# Patient Record
Sex: Male | Born: 1995
Health system: Southern US, Community
[De-identification: ages and names within clinical notes are randomized; demographics above are authoritative.]

## PROBLEM LIST (undated history)

## (undated) DIAGNOSIS — F29 Unspecified psychosis not due to a substance or known physiological condition: Secondary | ICD-10-CM

## (undated) DIAGNOSIS — F988 Other specified behavioral and emotional disorders with onset usually occurring in childhood and adolescence: Secondary | ICD-10-CM

## (undated) DIAGNOSIS — F602 Antisocial personality disorder: Secondary | ICD-10-CM

---
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
## 2013-08-09 ENCOUNTER — Ambulatory Visit: Payer: Federal, State, Local not specified - PPO | Admitting: Family Medicine

## 2015-10-15 ENCOUNTER — Encounter: Payer: Self-pay | Admitting: Internal Medicine

## 2015-10-15 ENCOUNTER — Ambulatory Visit (INDEPENDENT_AMBULATORY_CARE_PROVIDER_SITE_OTHER): Payer: Federal, State, Local not specified - PPO | Admitting: Internal Medicine

## 2015-10-15 VITALS — BP 124/86 | HR 104 | Temp 98.3°F | Ht 74.0 in | Wt 190.0 lb

## 2015-10-15 DIAGNOSIS — F909 Attention-deficit hyperactivity disorder, unspecified type: Secondary | ICD-10-CM | POA: Diagnosis not present

## 2015-10-15 DIAGNOSIS — F988 Other specified behavioral and emotional disorders with onset usually occurring in childhood and adolescence: Secondary | ICD-10-CM

## 2015-10-15 NOTE — Progress Notes (Signed)
Pre visit review using our clinic review tool, if applicable. No additional management support is needed unless otherwise documented below in the visit note. 

## 2015-10-15 NOTE — Progress Notes (Signed)
HPI  Elijah Castillo presents to the clinic today to establish care and for management of the conditions listed below. He has not had a PCP in many years.  ADD: He reports he was treated with Vyvanse, Adderall, and Concerta as a child. He is not currently taking any medication at this time.  Flu: never Tetanus: 2007 Dentist: as needed  Past Medical History  Diagnosis Date  . ADD (attention deficit disorder)     No current outpatient prescriptions on file.   No current facility-administered medications for this visit.    No Known Allergies  Family History  Problem Relation Age of Onset  . Arthritis Mother   . Hyperlipidemia Mother   . Hypertension Mother   . Alcohol abuse Maternal Grandmother   . Diabetes Maternal Grandmother   . Heart disease Maternal Grandmother   . Diabetes Maternal Grandfather   . Heart disease Maternal Grandfather   . Alcohol abuse Paternal Grandfather     Social History   Social History  . Marital Status: Single    Spouse Name: N/A  . Number of Children: N/A  . Years of Education: N/A   Occupational History  . Not on file.   Social History Main Topics  . Smoking status: Current Every Day Smoker -- 0.33 packs/day    Types: Cigarettes  . Smokeless tobacco: Never Used  . Alcohol Use: 0.0 oz/week    0 Standard drinks or equivalent per week     Comment: rare--beer  . Drug Use: Yes    Special: Marijuana     Comment: daily use  . Sexual Activity: Not on file   Other Topics Concern  . Not on file   Social History Narrative    ROS:  Constitutional: Denies fever, malaise, fatigue, headache or abrupt weight changes.  Respiratory: Denies difficulty breathing, shortness of breath, cough or sputum production.   Cardiovascular: Denies chest pain, chest tightness, palpitations or swelling in the hands or feet.  Skin: Denies redness, rashes, lesions or ulcercations.  Neurological: Denies dizziness, difficulty with memory, difficulty with speech or  problems with balance and coordination.  Psych: Denies anxiety, depression, SI/HI.  No other specific complaints in a complete review of systems (except as listed in HPI above).  PE:  BP 124/86 mmHg  Pulse 104  Temp(Src) 98.3 F (36.8 C) (Oral)  Ht 6\' 2"  (1.88 m)  Wt 190 lb (86.183 kg)  BMI 24.38 kg/m2  SpO2 99%  Wt Readings from Last 3 Encounters:  10/15/15 190 lb (86.183 kg)  12/03/11 215 lb (97.523 kg) (97 %*, Z = 1.85)   * Growth percentiles are based on CDC 2-20 Years data.    General: Appears his stated age, well developed, well nourished in NAD. Skin: Dry and intact. Cardiovascular: Normal rate and rhythm. S1,S2 noted.  No murmur, rubs or gallops noted. Pulmonary/Chest: Normal effort and positive vesicular breath sounds. No respiratory distress. No wheezes, rales or ronchi noted.  Neurological: Alert and oriented.  Psychiatric: Mood and affect normal. Behavior is normal. Judgment and thought content normal.    BMET    Component Value Date/Time   NA 137 06/28/2012 2240   K 3.5 06/28/2012 2240   CL 101 06/28/2012 2240   CO2 25 06/28/2012 2240   GLUCOSE 85 06/28/2012 2240   BUN 16 06/28/2012 2240   CREATININE 1.21 06/28/2012 2240   CALCIUM 9.9 06/28/2012 2240   GFRNONAA 86* 06/28/2012 2240   GFRAA >90 06/28/2012 2240    Lipid Panel  No  results found for: CHOL, TRIG, HDL, CHOLHDL, VLDL, LDLCALC  CBC    Component Value Date/Time   WBC 6.8 06/28/2012 2240   RBC 4.40 06/28/2012 2240   HGB 13.8 06/28/2012 2240   HCT 39.7 06/28/2012 2240   PLT 216 06/28/2012 2240   MCV 90.2 06/28/2012 2240   MCH 31.4 06/28/2012 2240   MCHC 34.8 06/28/2012 2240   RDW 13.0 06/28/2012 2240   LYMPHSABS 1.4 12/03/2011 0529   MONOABS 0.3 12/03/2011 0529   EOSABS 0.0 12/03/2011 0529   BASOSABS 0.0 12/03/2011 0529    Hgb A1C No results found for: HGBA1C   Assessment and Plan:  Make an appt for your annual exam

## 2015-10-15 NOTE — Patient Instructions (Signed)
Attention Deficit Hyperactivity Disorder  Attention deficit hyperactivity disorder (ADHD) is a problem with behavior issues based on the way the brain functions (neurobehavioral disorder). It is a common reason for behavior and academic problems in school.  SYMPTOMS   There are 3 types of ADHD. The 3 types and some of the symptoms include:  · Inattentive.    Gets bored or distracted easily.    Loses or forgets things. Forgets to hand in homework.    Has trouble organizing or completing tasks.    Difficulty staying on task.    An inability to organize daily tasks and school work.    Leaving projects, chores, or homework unfinished.    Trouble paying attention or responding to details. Careless mistakes.    Difficulty following directions. Often seems like is not listening.    Dislikes activities that require sustained attention (like chores or homework).  · Hyperactive-impulsive.    Feels like it is impossible to sit still or stay in a seat. Fidgeting with hands and feet.    Trouble waiting turn.    Talking too much or out of turn. Interruptive.    Speaks or acts impulsively.    Aggressive, disruptive behavior.    Constantly busy or on the go; noisy.    Often leaves seat when they are expected to remain seated.    Often runs or climbs where it is not appropriate, or feels very restless.  · Combined.    Has symptoms of both of the above.  Often children with ADHD feel discouraged about themselves and with school. They often perform well below their abilities in school.  As children get older, the excess motor activities can calm down, but the problems with paying attention and staying organized persist. Most children do not outgrow ADHD but with good treatment can learn to cope with the symptoms.  DIAGNOSIS   When ADHD is suspected, the diagnosis should be made by professionals trained in ADHD. This professional will collect information about the individual suspected of having ADHD. Information must be collected from  various settings where the person lives, works, or attends school.    Diagnosis will include:  · Confirming symptoms began in childhood.  · Ruling out other reasons for the child's behavior.  · The health care providers will check with the child's school and check their medical records.  · They will talk to teachers and parents.  · Behavior rating scales for the child will be filled out by those dealing with the child on a daily basis.  A diagnosis is made only after all information has been considered.  TREATMENT   Treatment usually includes behavioral treatment, tutoring or extra support in school, and stimulant medicines. Because of the way a person's brain works with ADHD, these medicines decrease impulsivity and hyperactivity and increase attention. This is different than how they would work in a person who does not have ADHD. Other medicines used include antidepressants and certain blood pressure medicines.  Most experts agree that treatment for ADHD should address all aspects of the person's functioning. Along with medicines, treatment should include structured classroom management at school. Parents should reward good behavior, provide constant discipline, and set limits. Tutoring should be available for the child as needed.  ADHD is a lifelong condition. If untreated, the disorder can have long-term serious effects into adolescence and adulthood.  HOME CARE INSTRUCTIONS   · Often with ADHD there is a lot of frustration among family members dealing with the condition. Blame   and anger are also feelings that are common. In many cases, because the problem affects the family as a whole, the entire family may need help. A therapist can help the family find better ways to handle the disruptive behaviors of the person with ADHD and promote change. If the person with ADHD is young, most of the therapist's work is with the parents. Parents will learn techniques for coping with and improving their child's behavior.  Sometimes only the child with the ADHD needs counseling. Your health care providers can help you make these decisions.  · Children with ADHD may need help learning how to organize. Some helpful tips include:  ¨ Keep routines the same every day from wake-up time to bedtime. Schedule all activities, including homework and playtime. Keep the schedule in a place where the person with ADHD will often see it. Mark schedule changes as far in advance as possible.  ¨ Schedule outdoor and indoor recreation.  ¨ Have a place for everything and keep everything in its place. This includes clothing, backpacks, and school supplies.  ¨ Encourage writing down assignments and bringing home needed books. Work with your child's teachers for assistance in organizing school work.  · Offer your child a well-balanced diet. Breakfast that includes a balance of whole grains, protein, and fruits or vegetables is especially important for school performance. Children should avoid drinks with caffeine including:  ¨ Soft drinks.  ¨ Coffee.  ¨ Tea.  ¨ However, some older children (adolescents) may find these drinks helpful in improving their attention. Because it can also be common for adolescents with ADHD to become addicted to caffeine, talk with your health care provider about what is a safe amount of caffeine intake for your child.  · Children with ADHD need consistent rules that they can understand and follow. If rules are followed, give small rewards. Children with ADHD often receive, and expect, criticism. Look for good behavior and praise it. Set realistic goals. Give clear instructions. Look for activities that can foster success and self-esteem. Make time for pleasant activities with your child. Give lots of affection.  · Parents are their children's greatest advocates. Learn as much as possible about ADHD. This helps you become a stronger and better advocate for your child. It also helps you educate your child's teachers and instructors  if they feel inadequate in these areas. Parent support groups are often helpful. A national group with local chapters is called Children and Adults with Attention Deficit Hyperactivity Disorder (CHADD).  SEEK MEDICAL CARE IF:  · Your child has repeated muscle twitches, cough, or speech outbursts.  · Your child has sleep problems.  · Your child has a marked loss of appetite.  · Your child develops depression.  · Your child has new or worsening behavioral problems.  · Your child develops dizziness.  · Your child has a racing heart.  · Your child has stomach pains.  · Your child develops headaches.  SEEK IMMEDIATE MEDICAL CARE IF:  · Your child has been diagnosed with depression or anxiety and the symptoms seem to be getting worse.  · Your child has been depressed and suddenly appears to have increased energy or motivation.  · You are worried that your child is having a bad reaction to a medication he or she is taking for ADHD.     This information is not intended to replace advice given to you by your health care provider. Make sure you discuss any questions you have with your   health care provider.     Document Released: 03/06/2002 Document Revised: 03/21/2013 Document Reviewed: 11/21/2012  Elsevier Interactive Patient Education ©2016 [employer]

## 2015-10-15 NOTE — Assessment & Plan Note (Signed)
As a child He is not interested in medication therapy at this time

## 2016-03-04 ENCOUNTER — Ambulatory Visit: Payer: Federal, State, Local not specified - PPO | Admitting: Family Medicine

## 2016-09-25 ENCOUNTER — Ambulatory Visit (INDEPENDENT_AMBULATORY_CARE_PROVIDER_SITE_OTHER): Payer: Federal, State, Local not specified - PPO | Admitting: Licensed Clinical Social Worker

## 2016-09-25 DIAGNOSIS — F1220 Cannabis dependence, uncomplicated: Secondary | ICD-10-CM | POA: Diagnosis not present

## 2016-09-28 ENCOUNTER — Encounter (HOSPITAL_COMMUNITY): Payer: Self-pay | Admitting: Licensed Clinical Social Worker

## 2016-09-28 DIAGNOSIS — K080 Exfoliation of teeth due to systemic causes: Secondary | ICD-10-CM | POA: Diagnosis not present
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
## 2016-10-07 ENCOUNTER — Telehealth (HOSPITAL_COMMUNITY): Payer: Self-pay | Admitting: Licensed Clinical Social Worker

## 2016-10-07 NOTE — Telephone Encounter (Signed)
Counselor phoned for f/o on pt status since CCA 1 week ago. Talked with pt's mother about options for further tx in PHP, individual thearpy, and/or CD-IOP. Mother agreed right now, pt is stable, but is aware of his options for tx and will consider if needed. total call times 3 min.

## 2016-10-19 ENCOUNTER — Telehealth (HOSPITAL_COMMUNITY): Payer: Self-pay | Admitting: Licensed Clinical Social Worker

## 2016-10-19 ENCOUNTER — Encounter (HOSPITAL_COMMUNITY): Payer: Self-pay | Admitting: Licensed Clinical Social Worker

## 2016-10-19 ENCOUNTER — Ambulatory Visit (HOSPITAL_COMMUNITY): Payer: Self-pay | Admitting: Licensed Clinical Social Worker

## 2016-10-19 NOTE — Telephone Encounter (Signed)
Counselor phoned pt's mother to inquire about why pt was missing session. Mother states "he wanted to reschedule for no good reason". She indicated he appears to be "putting counseling off". Counselor asked about any developments into pt's status and there was nothing pressing. Mother said they rescheduled individual counseling for the following week.

## 2016-10-27 ENCOUNTER — Ambulatory Visit (HOSPITAL_COMMUNITY): Payer: Self-pay | Admitting: Licensed Clinical Social Worker

## 2016-11-10 ENCOUNTER — Encounter (HOSPITAL_COMMUNITY): Payer: Self-pay | Admitting: Psychiatry

## 2016-11-10 ENCOUNTER — Ambulatory Visit (INDEPENDENT_AMBULATORY_CARE_PROVIDER_SITE_OTHER): Payer: Federal, State, Local not specified - PPO | Admitting: Psychiatry

## 2016-11-10 VITALS — BP 122/70 | HR 64 | Ht 75.0 in | Wt 193.0 lb

## 2016-11-10 DIAGNOSIS — F1221 Cannabis dependence, in remission: Secondary | ICD-10-CM | POA: Diagnosis not present

## 2016-11-10 DIAGNOSIS — Z62898 Other specified problems related to upbringing: Secondary | ICD-10-CM

## 2016-11-10 DIAGNOSIS — Z811 Family history of alcohol abuse and dependence: Secondary | ICD-10-CM

## 2016-11-10 DIAGNOSIS — F21 Schizotypal disorder: Secondary | ICD-10-CM | POA: Diagnosis not present

## 2016-11-10 DIAGNOSIS — F17210 Nicotine dependence, cigarettes, uncomplicated: Secondary | ICD-10-CM | POA: Diagnosis not present

## 2016-11-10 MED ORDER — FISH OIL 600 MG PO CAPS: ORAL_CAPSULE | ORAL | Status: DC
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
## 2018-06-16 ENCOUNTER — Ambulatory Visit: Payer: Federal, State, Local not specified - PPO | Admitting: Internal Medicine

## 2018-06-16 ENCOUNTER — Other Ambulatory Visit: Payer: Self-pay

## 2018-06-16 ENCOUNTER — Encounter: Payer: Self-pay | Admitting: Internal Medicine

## 2018-06-16 VITALS — BP 124/78 | HR 88 | Temp 98.0°F | Wt 190.0 lb

## 2018-06-16 DIAGNOSIS — R454 Irritability and anger: Secondary | ICD-10-CM

## 2018-06-16 DIAGNOSIS — F339 Major depressive disorder, recurrent, unspecified: Secondary | ICD-10-CM

## 2018-06-16 NOTE — Progress Notes (Signed)
Subjective:    Patient ID: Elijah Castillo, male    DOB: Apr 25, 1995, 23 y.o.   MRN: [id number]  HPI  Pt presents to the clinic today, accompanied by his mother, requesting referral to psych. He was recently incarcerated, released 1 week ago. He was seen by psych during incarceration for Adult ADHD. Mother feels like something else is going on. He denies anxiety, depression, hallucinations, paranoia. He does smoke weed daily. He does report racing thoughts. He denies SI/HI. His mother reports her daughter has shizophrenia, bipolar. She would like him to be further evaluated for mental health conditions.  Review of Systems      Past Medical History:  Diagnosis Date  . ADD (attention deficit disorder)     No current outpatient medications on file.   No current facility-administered medications for this visit.     No Known Allergies  Family History  Problem Relation Age of Onset  . Arthritis Mother   . Hyperlipidemia Mother   . Hypertension Mother   . Alcohol abuse Maternal Grandmother   . Diabetes Maternal Grandmother   . Heart disease Maternal Grandmother   . Diabetes Maternal Grandfather   . Heart disease Maternal Grandfather   . Alcohol abuse Paternal Grandfather     Social History   Socioeconomic History  . Marital status: Single    Spouse name: Not on file  . Number of children: Not on file  . Years of education: Not on file  . Highest education level: Not on file  Occupational History  . Not on file  Social Needs  . Financial resource strain: Not on file  . Food insecurity:    Worry: Not on file    Inability: Not on file  . Transportation needs:    Medical: Not on file    Non-medical: Not on file  Tobacco Use  . Smoking status: Current Every Day Smoker    Packs/day: 0.33    Years: 8.00    Pack years: 2.64    Types: Cigarettes  . Smokeless tobacco: Never Used  Substance and Sexual Activity  . Alcohol use: Yes    Alcohol/week: 0.0 standard drinks     Comment: rare--beer  . Drug use: Yes    Types: Marijuana  . Sexual activity: Yes    Birth control/protection: Condom  Lifestyle  . Physical activity:    Days per week: Not on file    Minutes per session: Not on file  . Stress: Not on file  Relationships  . Social connections:    Talks on phone: Not on file    Gets together: Not on file    Attends religious service: Not on file    Active member of club or organization: Not on file    Attends meetings of clubs or organizations: Not on file    Relationship status: Not on file  . Intimate partner violence:    Fear of current or ex partner: Not on file    Emotionally abused: Not on file    Physically abused: Not on file    Forced sexual activity: Not on file  Other Topics Concern  . Not on file  Social History Narrative  . Not on file     Constitutional: Denies fever, malaise, fatigue, headache or abrupt weight changes.  Respiratory: Denies difficulty breathing, shortness of breath, cough or sputum production.   Cardiovascular: Denies chest pain, chest tightness, palpitations or swelling in the hands or feet.  Neurological: Denies dizziness, difficulty with  memory, difficulty with speech or problems with balance and coordination.  Psych: Denies anxiety, depression, SI/HI.  No other specific complaints in a complete review of systems (except as listed in HPI above).  Objective:   Physical Exam BP 124/78   Pulse 88   Temp 98 F (36.7 C) (Oral)   Wt 190 lb (86.2 kg)   SpO2 98%   BMI 23.75 kg/m  Wt Readings from Last 3 Encounters:  06/16/18 190 lb (86.2 kg)  10/15/15 190 lb (86.2 kg)  12/03/11 215 lb (97.5 kg) (97 %, Z= 1.85)*   * Growth percentiles are based on CDC (Boys, 2-20 Years) data.    General: Appears his stated age, well developed, well nourished in NAD. Cardiovascular: Normal rate and rhythm.  Pulmonary/Chest: Normal effort and positive vesicular breath sounds. No respiratory distress. No wheezes, rales  or ronchi noted.  Neurological: Alert and oriented. Psychiatric: Appears distracted, fidgety. Answers no to all questions. Affect flat. Body language normal.   BMET    Component Value Date/Time   NA 137 06/28/2012 2240   K 3.5 06/28/2012 2240   CL 101 06/28/2012 2240   CO2 25 06/28/2012 2240   GLUCOSE 85 06/28/2012 2240   BUN 16 06/28/2012 2240   CREATININE 1.21 06/28/2012 2240   CALCIUM 9.9 06/28/2012 2240   GFRNONAA 86 (L) 06/28/2012 2240   GFRAA >90 06/28/2012 2240    Lipid Panel  No results found for: CHOL, TRIG, HDL, CHOLHDL, VLDL, LDLCALC  CBC    Component Value Date/Time   WBC 6.8 06/28/2012 2240   RBC 4.40 06/28/2012 2240   HGB 13.8 06/28/2012 2240   HCT 39.7 06/28/2012 2240   PLT 216 06/28/2012 2240   MCV 90.2 06/28/2012 2240   MCH 31.4 06/28/2012 2240   MCHC 34.8 06/28/2012 2240   RDW 13.0 06/28/2012 2240   LYMPHSABS 1.4 12/03/2011 0529   MONOABS 0.3 12/03/2011 0529   EOSABS 0.0 12/03/2011 0529   BASOSABS 0.0 12/03/2011 0529    Hgb A1C No results found for: HGBA1C           Assessment & Plan:   Depression, Excessive Anger:  Referral to psych Discussed appt will likely be made 6-8 weeks out Discussed [facility], location and hours Contracts for safety  Return precautions discussed Nicki Reaper, NP

## 2018-06-16 NOTE — Patient Instructions (Signed)
Depression Screening Depression screening is a tool that your health care provider can use to learn if you have symptoms of depression. Depression is a common condition with many symptoms that are also often found in other conditions. Depression is treatable, but it must first be diagnosed. You may not know that certain feelings, thoughts, and behaviors that you are having can be symptoms of depression. Taking a depression screening test can help you and your health care provider decide if you need more assessment, or if you should be referred to a mental health care provider. What are the screening tests?  You may have a physical exam to see if another condition is affecting your mental health. You may have a blood or urine sample taken during the physical exam.  You may be interviewed using a screening tool that was developed from research, such as one of these: ? Patient Health Questionnaire (PHQ). This is a set of either 2 or 9 questions. A health care provider who has been trained to score this screening test uses a guide to assess if your symptoms suggest that you may have depression. ? Hamilton Depression Rating Scale (HAM-D). This is a set of either 17 or 24 questions. You may be asked to take it again during or after your treatment, to see if your depression has gotten better. ? Beck Depression Inventory (BDI). This is a set of 21 multiple choice questions. Your health care provider scores your answers to assess:  Your level of depression, ranging from mild to severe.  Your response to treatment.  Your health care provider may talk with you about your daily activities, such as eating, sleeping, work, and recreation, and ask if you have had any changes in activity.  Your health care provider may ask you to see a mental health specialist, such as a psychiatrist or psychologist, for more evaluation. Who should be screened for depression?   All adults, including adults with a family history  of a mental health disorder.  Adolescents who are 12-18 years old.  People who are recovering from a myocardial infarction (MI).  Pregnant women, or women who have given birth.  People who have a long-term (chronic) illness.  Anyone who has been diagnosed with another type of a mental health disorder.  Anyone who has symptoms that could show depression. What do my results mean? Your health care provider will review the results of your depression screening, physical exam, and lab tests. Positive screens suggest that you may have depression. Screening is the first step in getting the care that you may need. It is up to you to get your screening results. Ask your health care provider, or the department that is doing your screening tests, when your results will be ready. Talk with your health care provider about your results and diagnosis. A diagnosis of depression is made using the Diagnostic and Statistical Manual of Mental Disorders (DSM-V). This is a book that lists the number and type of symptoms that must be present for a health care provider to give a specific diagnosis.  Your health care provider may work with you to treat your symptoms of depression, or your health care provider may help you find a mental health provider who can assess, diagnose, and treat your depression. Get help right away if:  You have thoughts about hurting yourself or others. If you ever feel like you may hurt yourself or others, or have thoughts about taking your own life, get help right away. You   can go to your nearest emergency department or call:  Your local emergency services (911 in the [place]  A suicide crisis helpline, such as the National Suicide Prevention Lifeline at 1-800-273-8255. This is open 24 hours a day. Summary  Depression screening is the first step in getting the help that you may need.  If your screening test shows symptoms of depression (is positive), your health care provider may ask  you to see a mental health provider.  Anyone who is age 12 or older should be screened for depression. This information is not intended to replace advice given to you by your health care provider. Make sure you discuss any questions you have with your health care provider. Document Released: 07/31/2016 Document Revised: 07/31/2016 Document Reviewed: 07/31/2016 Elsevier Interactive Patient Education  2019 [employer]  

## 2018-06-20 ENCOUNTER — Other Ambulatory Visit: Payer: Self-pay | Admitting: Internal Medicine

## 2018-06-20 DIAGNOSIS — R454 Irritability and anger: Secondary | ICD-10-CM

## 2018-06-20 DIAGNOSIS — F339 Major depressive disorder, recurrent, unspecified: Secondary | ICD-10-CM

## 2018-06-20 NOTE — Progress Notes (Signed)
a 

## 2018-07-19 ENCOUNTER — Ambulatory Visit: Payer: Self-pay | Admitting: *Deleted

## 2018-07-19 ENCOUNTER — Encounter (HOSPITAL_COMMUNITY): Payer: Self-pay

## 2018-07-19 ENCOUNTER — Ambulatory Visit (HOSPITAL_COMMUNITY)
Admission: EM | Admit: 2018-07-19 | Discharge: 2018-07-19 | Disposition: A | Discharge status: Home / Self Care | Payer: Federal, State, Local not specified - PPO | Attending: Family Medicine | Admitting: Family Medicine

## 2018-07-19 ENCOUNTER — Other Ambulatory Visit: Payer: Self-pay

## 2018-07-19 DIAGNOSIS — R079 Chest pain, unspecified: Secondary | ICD-10-CM

## 2018-07-19 DIAGNOSIS — R000 Tachycardia, unspecified: Secondary | ICD-10-CM | POA: Diagnosis not present

## 2018-07-19 DIAGNOSIS — W57XXXA Bitten or stung by nonvenomous insect and other nonvenomous arthropods, initial encounter: Secondary | ICD-10-CM

## 2018-07-19 DIAGNOSIS — R52 Pain, unspecified: Secondary | ICD-10-CM | POA: Diagnosis not present

## 2018-07-19 DIAGNOSIS — R509 Fever, unspecified: Secondary | ICD-10-CM

## 2018-07-19 NOTE — Telephone Encounter (Signed)
Patient is having some pressure in his chest- he is having joint pain and fast heart rate. Patient has inflammation on back- mother thought possible insect bite. Patient does not have cough or fever or congestion.  Reason for Disposition . New or worsened shortness of breath with activity (dyspnea on exertion)    Patient is having pain with inspiration- patient states he woke up this morning with joint pain and has not gotten better today- he states he has increased heart rate and pain with deep breath. Resting heart rate is 110.  Answer Assessment - Initial Assessment Questions 1. DESCRIPTION: "Please describe your heart rate or heart beat that you are having" (e.g., fast/slow, regular/irregular, skipped or extra beats, "palpitations")     Fast- pressure- feels off 2. ONSET: "When did it start?" (Minutes, hours or days)      This morning 3. DURATION: "How long does it last" (e.g., seconds, minutes, hours)     If breathing through nose normally- heart feels fast- but when holds breath he feels pain 4. PATTERN "Does it come and go, or has it been constant since it started?"  "Does it get worse with exertion?"   "Are you feeling it now?"     Constant- worse if laying on left side-right side- yes 5. TAP: "Using your hand, can you tap out what you are feeling on a chair or table in front of you, so that I can hear?" (Note: not all patients can do this)       n/a 6. HEART RATE: "Can you tell me your heart rate?" "How many beats in 15 seconds?"  (Note: not all patients can do this)       110 bpm 7. RECURRENT SYMPTOM: "Have you ever had this before?" If so, ask: "When was the last time?" and "What happened that time?"      no 8. CAUSE: "What do you think is causing the palpitations?"     Unknown-raised areas- several area 9. CARDIAC HISTORY: "Do you have any history of heart disease?" (e.g., heart attack, angina, bypass surgery, angioplasty, arrhythmia)      no 10. OTHER SYMPTOMS: "Do you have any  other symptoms?" (e.g., dizziness, chest pain, sweating, difficulty breathing)       Pain with breathing 11. PREGNANCY: "Is there any chance you are pregnant?" "When was your last menstrual period?"       n/a  Protocols used: HEART RATE AND HEARTBEAT QUESTIONS-A-AH

## 2018-07-19 NOTE — Discharge Instructions (Addendum)
You may take Tylenol or Advil as needed. Please do your best to ensure adequate fluid intake in order to avoid dehydration.

## 2018-07-19 NOTE — ED Triage Notes (Signed)
Pt presents with generalized chest pain, headache, and nausea today upon waking up.

## 2018-07-20 NOTE — ED Provider Notes (Signed)
[facility]   [id number] 07/19/18 Arrival Time: 1654  ASSESSMENT & PLAN:  1. Generalized body aches   2. Chest pain, unspecified type   3. Insect bite, unspecified site, initial encounter    Patient history and exam consistent with non-cardiac cause of chest pain. Conservative measures indicated. OTC analgesics as needed.   Discussed possibility of early viral illness given temp of 99.7 here and slight tachycardia. No signs of skin infection around presumed insect bites.  Chest pain precautions given. Reviewed expectations re: course of current medical issues. Questions answered. Outlined signs and symptoms indicating need for more acute intervention. Patient verbalized understanding. After Visit Summary given.   SUBJECTIVE: History from: patient. Elijah Castillo is a 23 y.o. male who presents with complaint of persistent generalized chest discomfort described as dull that does not radiate. First noticed this morning upon waking. Reports symptoms are unchanged since beginning. Injury or recent strenuous activity: none. "Just feel tired". Mild body aches. Rates as 2/10 in intensity when present; without associated n/v; without associated SOB. Denies: dyspnea, irregular heart beat, lower extremity edema, near-syncope, orthopnea, palpitations, paroxysmal nocturnal dyspnea and syncope. Aggravating factors: have not been identified. Alleviating factors: have not been identified. Recent illnesses: none. Fever: none reported/suspected. Ambulatory without assistance. Self/OTC treatment: none. Illicit drug use: occasional THC.  Social History   Tobacco Use  Smoking Status Current Every Day Smoker  . Packs/day: 0.33  . Years: 8.00  . Pack years: 2.64  . Types: Cigarettes  Smokeless Tobacco Never Used   Social History   Substance and Sexual Activity  Alcohol Use Yes  . Alcohol/week: 0.0 standard drinks   Comment: rare--beer   Also questions possible insect  bites on his back. Mild itching and soreness. Noticed today.  ROS: As per HPI. All other systems negative.   OBJECTIVE:  Vitals:   07/19/18 1712  BP: 135/85  Pulse: (!) 109  Resp: 18  Temp: 99.7 F (37.6 C)  TempSrc: Oral  SpO2: 99%    General appearance: alert, oriented, no acute distress Eyes: PERRLA; EOMI; conjunctivae normal HENT: normocephalic; atraumatic Neck: supple with FROM Lungs: without labored respirations; CTAB Heart: slight tachycardia; regular rhythm without murmer Chest Wall: without tenderness to palpation Abdomen: soft, non-tender; bowel sounds normal; no masses or organomegaly; no guarding or rebound tenderness Extremities: without edema; without calf swelling or tenderness; symmetrical without gross deformities Skin: warm and dry; mid back with a cluster of approx 1mm erythematous wheals/indurations; normal skin temperature; mild tenderness to touch Psychological: alert and cooperative; normal mood and affect  No Known Allergies  Past Medical History:  Diagnosis Date  . ADD (attention deficit disorder)    Social History   Socioeconomic History  . Marital status: Single    Spouse name: Not on file  . Number of children: Not on file  . Years of education: Not on file  . Highest education level: Not on file  Occupational History  . Not on file  Social Needs  . Financial resource strain: Not on file  . Food insecurity:    Worry: Not on file    Inability: Not on file  . Transportation needs:    Medical: Not on file    Non-medical: Not on file  Tobacco Use  . Smoking status: Current Every Day Smoker    Packs/day: 0.33    Years: 8.00    Pack years: 2.64    Types: Cigarettes  . Smokeless tobacco: Never Used  Substance and Sexual Activity  .  Alcohol use: Yes    Alcohol/week: 0.0 standard drinks    Comment: rare--beer  . Drug use: Yes    Types: Marijuana  . Sexual activity: Yes    Birth control/protection: Condom  Lifestyle  . Physical  activity:    Days per week: Not on file    Minutes per session: Not on file  . Stress: Not on file  Relationships  . Social connections:    Talks on phone: Not on file    Gets together: Not on file    Attends religious service: Not on file    Active member of club or organization: Not on file    Attends meetings of clubs or organizations: Not on file    Relationship status: Not on file  . Intimate partner violence:    Fear of current or ex partner: Not on file    Emotionally abused: Not on file    Physically abused: Not on file    Forced sexual activity: Not on file  Other Topics Concern  . Not on file  Social History Narrative  . Not on file   Family History  Problem Relation Age of Onset  . Arthritis Mother   . Hyperlipidemia Mother   . Hypertension Mother   . Alcohol abuse Maternal Grandmother   . Diabetes Maternal Grandmother   . Heart disease Maternal Grandmother   . Diabetes Maternal Grandfather   . Heart disease Maternal Grandfather   . Alcohol abuse Paternal Grandfather    Past Surgical History:  Procedure Laterality Date  . TONSILLECTOMY  Bonnell Public, MD 07/20/18 1311

## 2018-07-20 NOTE — Telephone Encounter (Signed)
Set up [email or web address] ER follow up

## 2018-07-22 NOTE — Telephone Encounter (Signed)
I have attempted to call pt twice, no answer.

## 2018-07-26 NOTE — Telephone Encounter (Signed)
I left another voicemail, third attempt. I requested a call back for scheduling.

## 2018-10-07 ENCOUNTER — Ambulatory Visit (HOSPITAL_COMMUNITY)
Admission: RE | Admit: 2018-10-07 | Discharge: 2018-10-07 | Disposition: A | Discharge status: Home / Self Care | Payer: Federal, State, Local not specified - PPO | Attending: Psychiatry | Admitting: Psychiatry

## 2018-10-07 ENCOUNTER — Ambulatory Visit (HOSPITAL_COMMUNITY): Admit: 2018-10-07 | Payer: Federal, State, Local not specified - PPO | Admitting: Psychiatry

## 2018-10-07 DIAGNOSIS — F17210 Nicotine dependence, cigarettes, uncomplicated: Secondary | ICD-10-CM | POA: Insufficient documentation

## 2018-10-07 DIAGNOSIS — F6381 Intermittent explosive disorder: Secondary | ICD-10-CM | POA: Insufficient documentation
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
## 2018-10-07 NOTE — H&P (Signed)
Behavioral Health Medical Screening Exam  Elijah Castillo is an 23 y.o. male with anger after an argument with his sister. Patient reports he was praying when his sister who is diagnosed paranoid schizophrenic and deemed to have no mental capacity by the state interrupted his prayers. He became upset with her in which he attempted to go shower so he could leave, but was unable to do so as the bathroom was filthy and urine standing in the toilet. This further lead to verbal altercation, and his mother became frightened at that time and iVC the patient. He has a history of ADHD, Cannabis use disorder, ASPD. He has no previous inpatient history, suicide attempts, or suicidal gestures. He has no history of homicidal ideations as per patient, however reports a history of legal charges including "making a bomb threat. At this time he denies SI/HI/AVH at this time. He currently resides with his mother, father, sister, and sisters children. He is out on bail after this time, with most recent time served 80 days in December.   Total Time spent with patient: 30 minutes  Psychiatric Specialty Exam: Physical Exam  ROS  There were no vitals taken for this visit.There is no height or weight on file to calculate BMI.  General Appearance: Guarded  Eye Contact:  Good  Speech:  Clear and Coherent and Pressured  Volume:  Increased  Mood:  Irritable  Affect:  Congruent and Labile  Thought Process:  Coherent, Linear and Descriptions of Associations: Intact  Orientation:  Full (Time, Place, and Person)  Thought Content:  Logical  Suicidal Thoughts:  No  Homicidal Thoughts:  No  Memory:  Immediate;   Fair Recent;   Fair  Judgement:  Fair  Insight:  Fair  Psychomotor Activity:  Increased  Concentration: Concentration: Fair and Attention Span: Fair  Recall:  [employer] of Knowledge:Fair  Language: Fair  Akathisia:  No  Handed:  Right  AIMS (if indicated):     Assets:  Communication Skills Desire for  Improvement Financial Resources/Insurance Housing Leisure Time Physical Health Talents/Skills Transportation Vocational/Educational  Sleep:       Musculoskeletal: Strength & Muscle Tone: within normal limits Gait & Station: normal Patient leans: N/A  There were no vitals taken for this visit.  Recommendations:  Based on my evaluation the patient does not appear to have an emergency medical condition. Will discharge home after collateral is obtained from his mother and father at this time. Upon chart review patient has had a referral placed for anger to psychiatrist and has been followed up with appropriate services. In addition to seeking help on his own for his mental illness. Will continue to recommend outpatient services and anger management at this time.   Suella Broad, FNP 10/07/2018, 3:50 PM

## 2018-12-11 ENCOUNTER — Other Ambulatory Visit: Payer: Self-pay

## 2018-12-11 ENCOUNTER — Emergency Department (HOSPITAL_COMMUNITY)
Admission: EM | Admit: 2018-12-11 | Discharge: 2018-12-11 | Disposition: A | Discharge status: Home / Self Care | Payer: Federal, State, Local not specified - PPO | Attending: Emergency Medicine | Admitting: Emergency Medicine

## 2018-12-11 ENCOUNTER — Emergency Department (HOSPITAL_COMMUNITY): Payer: Federal, State, Local not specified - PPO

## 2018-12-11 ENCOUNTER — Encounter (HOSPITAL_COMMUNITY): Payer: Self-pay | Admitting: Emergency Medicine

## 2018-12-11 DIAGNOSIS — S61213A Laceration without foreign body of left middle finger without damage to nail, initial encounter: Secondary | ICD-10-CM | POA: Insufficient documentation

## 2018-12-11 DIAGNOSIS — Y93H2 Activity, gardening and landscaping: Secondary | ICD-10-CM | POA: Insufficient documentation

## 2018-12-11 DIAGNOSIS — S61412A Laceration without foreign body of left hand, initial encounter: Secondary | ICD-10-CM | POA: Diagnosis not present

## 2018-12-11 DIAGNOSIS — F17210 Nicotine dependence, cigarettes, uncomplicated: Secondary | ICD-10-CM | POA: Diagnosis not present

## 2018-12-11 DIAGNOSIS — W271XXA Contact with garden tool, initial encounter: Secondary | ICD-10-CM | POA: Insufficient documentation

## 2018-12-11 DIAGNOSIS — Y999 Unspecified external cause status: Secondary | ICD-10-CM | POA: Insufficient documentation

## 2018-12-11 DIAGNOSIS — Y929 Unspecified place or not applicable: Secondary | ICD-10-CM | POA: Insufficient documentation

## 2018-12-11 DIAGNOSIS — S61211A Laceration without foreign body of left index finger without damage to nail, initial encounter: Secondary | ICD-10-CM | POA: Insufficient documentation

## 2018-12-11 DIAGNOSIS — S6992XA Unspecified injury of left wrist, hand and finger(s), initial encounter: Secondary | ICD-10-CM | POA: Diagnosis not present

## 2018-12-11 MED ORDER — CEPHALEXIN 250 MG PO CAPS
500.0000 mg | ORAL_CAPSULE | Freq: Once | ORAL | Status: AC
  Administered 2018-12-11: 11:00:00 500 mg via ORAL
  Filled 2018-12-11: qty 2

## 2018-12-11 MED ORDER — CEPHALEXIN 500 MG PO CAPS: 500.0000 mg | ORAL_CAPSULE | Freq: Three times a day (TID) | ORAL | 0 refills | Status: AC

## 2018-12-11 MED ORDER — LIDOCAINE HCL 2 % IJ SOLN
10.0000 mL | Freq: Once | INTRAMUSCULAR | Status: AC
  Administered 2018-12-11: 200 mg
  Filled 2018-12-11: qty 20

## 2018-12-11 NOTE — ED Notes (Signed)
Patient transported to X-ray 

## 2018-12-11 NOTE — ED Provider Notes (Signed)
[facility] Provider Note   CSN: [id number] Arrival date & time: 12/11/18  0815     History   Chief Complaint Chief Complaint  Patient presents with   Finger Injury    HPI Elijah Castillo is a [age] y.o. male without significant past medical history, presenting to the emergency department with sudden onset of lacerations to left second and third digits that occurred prior to arrival.  Patient states he was going to use a machete for lawn work when he accidentally grabbed to the serrated edge instead of the handle of the machete causing a laceration.  Reports mild pain associated, with a laceration to the palmar aspects of the second and third digits.  Denies numbness.  Tetanus was updated about 1 year ago.     The history is provided by the patient.    Past Medical History:  Diagnosis Date   ADD (attention deficit disorder)     Patient Active Problem List   Diagnosis Date Noted   ADD (attention deficit disorder) 10/15/2015    Past Surgical History:  Procedure Laterality Date   TONSILLECTOMY  2000        Home Medications    Prior to Admission medications   Medication Sig Start Date End Date Taking? Authorizing Provider  cephALEXin (KEFLEX) 500 MG capsule Take 1 capsule (500 mg total) by mouth 3 (three) times daily for 7 days. 12/11/18 12/18/18  Gratia Disla, [place] N, PA-C    Family History Family History  Problem Relation Age of Onset   Arthritis Mother    Hyperlipidemia Mother    Hypertension Mother    Alcohol abuse Maternal Grandmother    Diabetes Maternal Grandmother    Heart disease Maternal Grandmother    Diabetes Maternal Grandfather    Heart disease Maternal Grandfather    Alcohol abuse Paternal Grandfather     Social History Social History   Tobacco Use   Smoking status: Current Every Day Smoker    Packs/day: 0.33    Years: 8.00    Pack years: 2.64    Types: Cigarettes   Smokeless tobacco:  Never Used  Substance Use Topics   Alcohol use: Yes    Alcohol/week: 0.0 standard drinks    Comment: rare--beer   Drug use: Yes    Types: Marijuana     Allergies   Patient has no known allergies.   Review of Systems Review of Systems  Skin: Positive for wound.  Neurological: Negative for numbness.     Physical Exam Updated Vital Signs BP 135/78 (BP Location: Right Arm)    Pulse 60    Temp 98.4 F (36.9 C) (Oral)    Resp 20    Ht 6\' 4"  (1.93 m)    Wt 83.9 kg    SpO2 100%    BMI 22.52 kg/m   Physical Exam Vitals signs and nursing note reviewed.  Constitutional:      General: He is not in acute distress.    Appearance: He is well-developed.  HENT:     Head: Normocephalic and atraumatic.  Eyes:     Conjunctiva/sclera: Conjunctivae normal.  Cardiovascular:     Rate and Rhythm: Normal rate.  Pulmonary:     Effort: Pulmonary effort is normal.  Musculoskeletal:     Comments: Left hand:  There is ~2cm horizontal laceration to the palmar aspect of the middle phalanx of the second digit.  Not grossly contaminated.  Patient has some weakness with flexion of the DIP  of the second digit against resistance.  There is a ~2cm horizontal skin flap laceration to the palmar aspect of the proximal phalanx of the third digit.  Not grossly contaminated.  Patient has strong active flexion of the CP and PIP joint of against resistance.  Brisk capillary refill  Neurological:     Mental Status: He is alert.  Psychiatric:        Mood and Affect: Mood normal.        Behavior: Behavior normal.      ED Treatments / Results  Labs (all labs ordered are listed, but only abnormal results are displayed) Labs Reviewed - No data to display  EKG None  Radiology Dg Hand Complete Left  Result Date: 12/11/2018 CLINICAL DATA:  Laceration to the fingers this morning. EXAM: LEFT HAND - COMPLETE 3+ VIEW COMPARISON:  None. FINDINGS: There is no evidence of fracture or dislocation. There is no  evidence of arthropathy or other focal bone abnormality. Lacerations to the volar aspects of the second and third digits. No radiodense foreign bodies in the soft tissues. IMPRESSION: 1. Normal bones.  Soft tissue lacerations. 2. No radiopaque foreign body. Electronically Signed   By: Francene BoyersJames  Maxwell M.D.   On: 12/11/2018 09:02    Procedures .Marland Kitchen.Laceration Repair  Date/Time: 12/11/2018 10:23 AM Performed by: Kasem Mozer, [place] N, PA-C Authorized by: Neilan Rizzo, [place] N, PA-C   Consent:    Consent obtained:  Verbal   Consent given by:  Patient   Risks discussed:  Infection, pain, poor cosmetic result and tendon damage   Alternatives discussed:  No treatment Anesthesia (see MAR for exact dosages):    Anesthesia method:  Nerve block   Block needle gauge:  25 G   Block anesthetic:  Lidocaine 2% w/o epi   Block injection procedure:  Anatomic landmarks palpated   Block outcome:  Anesthesia achieved Laceration details:    Location:  Finger   Finger location:  L index finger   Length (cm):  2 Repair type:    Repair type:  Simple Pre-procedure details:    Preparation:  Patient was prepped and draped in usual sterile fashion and imaging obtained to evaluate for foreign bodies Exploration:    Hemostasis achieved with:  Direct pressure   Wound exploration: wound explored through full range of motion and entire depth of wound probed and visualized     Wound extent: tendon damage     Wound extent: no foreign bodies/material noted     Tendon damage location:  Upper extremity   Upper extremity tendon damage location:  Finger flexor   Tendon repair plan:  Refer for evaluation   Contaminated: no   Treatment:    Area cleansed with:  Saline   Amount of cleaning:  Standard   Irrigation solution:  Sterile saline   Visualized foreign bodies/material removed: no   Skin repair:    Repair method:  Sutures   Suture size:  5-0   Suture material:  Prolene   Suture technique:  Simple interrupted   Number  of sutures:  5 Approximation:    Approximation:  Close Post-procedure details:    Dressing:  Non-adherent dressing and splint for protection   Patient tolerance of procedure:  Tolerated well, no immediate complications .Marland Kitchen.Laceration Repair  Date/Time: 12/11/2018 10:24 AM Performed by: Sabrie Moritz, [place] N, PA-C Authorized by: Tanaiya Kolarik, [place] N, PA-C   Consent:    Consent obtained:  Verbal   Consent given by:  Patient   Risks discussed:  Pain, poor  cosmetic result and infection   Alternatives discussed:  No treatment Anesthesia (see MAR for exact dosages):    Anesthesia method:  Nerve block   Block needle gauge:  25 G   Block anesthetic:  Lidocaine 2% w/o epi   Block injection procedure:  Anatomic landmarks palpated   Block outcome:  Anesthesia achieved Laceration details:    Location:  Finger   Finger location:  L long finger   Length (cm):  2.5 Repair type:    Repair type:  Simple Pre-procedure details:    Preparation:  Patient was prepped and draped in usual sterile fashion and imaging obtained to evaluate for foreign bodies Exploration:    Hemostasis achieved with:  Direct pressure   Wound exploration: wound explored through full range of motion and entire depth of wound probed and visualized     Wound extent: no foreign bodies/material noted and no tendon damage noted     Contaminated: no   Treatment:    Area cleansed with:  Saline   Amount of cleaning:  Standard   Visualized foreign bodies/material removed: no   Skin repair:    Repair method:  Sutures   Suture size:  5-0   Suture material:  Prolene   Suture technique:  Simple interrupted   Number of sutures:  6 Approximation:    Approximation:  Close Post-procedure details:    Dressing:  Non-adherent dressing and splint for protection   Patient tolerance of procedure:  Tolerated well, no immediate complications   (including critical care time)  Medications Ordered in ED Medications  lidocaine (XYLOCAINE) 2 %  (with pres) injection 200 mg (200 mg Infiltration Given 12/11/18 0908)  cephALEXin (KEFLEX) capsule 500 mg (500 mg Oral Given 12/11/18 1048)     Initial Impression / Assessment and Plan / ED Course  I have reviewed the triage vital signs and the nursing notes.  Pertinent labs & imaging results that were available during my care of the patient were reviewed by me and considered in my medical decision making (see chart for details).        Patient presenting with laceration to left second and third digits from a machete that occurred prior to arrival.  Exam is concerning for possible flexor tendon injury of the index finger as patient is having weakness with flexion of the DIP joint.  This was discussed with Dr. Dion Saucier on-call for hand who recommends wound closure at bedside and outpatient follow-up with a hand specialist such as Dr. Merlyn Lot or Geiger.  These recommendations were discussed with patient who is agreeable with plan.  Wounds were closed at bedside.  Tetanus is up-to-date.  Index finger splinted in about 30 to 45 degrees of flexion.  Hand referral provided for close follow-up this week.  Wound care instructions and return precautions discussed.  Patient be discharged with Keflex.  Safe for discharge.  Patient discussed with Dr. Adela Lank.  Discussed results, findings, treatment and follow up. Patient advised of return precautions. Patient verbalized understanding and agreed with plan.  Final Clinical Impressions(s) / ED Diagnoses   Final diagnoses:  Laceration of left index finger without foreign body without damage to nail, initial encounter  Laceration of left middle finger without foreign body without damage to nail, initial encounter    ED Discharge Orders         Ordered    cephALEXin (KEFLEX) 500 MG capsule  3 times daily     12/11/18 1028  Cole Eastridge, [place] N, PA-C 12/11/18 [place], [place], DO 12/11/18 1416

## 2018-12-11 NOTE — Discharge Instructions (Addendum)
Please read instructions below.  Keep your wound clean and covered. In 24 hours, you can get your wound wet; gently clean it with soap and water, pat it dry, and reapply a clean bandage.  Keep the splint on your index finger for protection of the injured tendon. Keep your hand elevated as much as possible to help with swelling. Take the antibiotic as prescribed until gone. You can take ibuprofen/advil every 6 hours as needed for pain Call the hand specialist tomorrow morning to schedule a follow-up visit this week. Return to the ER for fever, pus draining from wound, redness, or new or worsening symptoms.

## 2018-12-11 NOTE — ED Triage Notes (Signed)
Pt. Stated, I was messing around with a machete  and cut my left index and middle finger.

## 2018-12-11 NOTE — ED Notes (Signed)
Pt standing in the door shouting "Nurse, the PA is nearby the pt states that he told us he has somewhere to be and he can't wait too much longer or he will walk out. The PA gives pt an update stating that she will be with him in a moment to suture his hand."

## 2018-12-11 NOTE — ED Notes (Signed)
ED Provider at bedside. 

## 2018-12-13 ENCOUNTER — Other Ambulatory Visit: Payer: Self-pay

## 2018-12-13 ENCOUNTER — Other Ambulatory Visit (HOSPITAL_COMMUNITY)
Admission: RE | Admit: 2018-12-13 | Discharge: 2018-12-13 | Disposition: A | Discharge status: Home / Self Care | Payer: Federal, State, Local not specified - PPO | Source: Ambulatory Visit | Attending: Orthopedic Surgery | Admitting: Orthopedic Surgery

## 2018-12-13 ENCOUNTER — Encounter (HOSPITAL_BASED_OUTPATIENT_CLINIC_OR_DEPARTMENT_OTHER): Payer: Self-pay

## 2018-12-13 DIAGNOSIS — Z20828 Contact with and (suspected) exposure to other viral communicable diseases: Secondary | ICD-10-CM | POA: Diagnosis not present

## 2018-12-13 DIAGNOSIS — Z01812 Encounter for preprocedural laboratory examination: Secondary | ICD-10-CM | POA: Insufficient documentation

## 2018-12-13 DIAGNOSIS — S66321A Laceration of extensor muscle, fascia and tendon of left index finger at wrist and hand level, initial encounter: Secondary | ICD-10-CM | POA: Diagnosis not present

## 2018-12-13 DIAGNOSIS — S66121A Laceration of flexor muscle, fascia and tendon of left index finger at wrist and hand level, initial encounter: Secondary | ICD-10-CM | POA: Diagnosis not present

## 2018-12-13 DIAGNOSIS — S61213A Laceration without foreign body of left middle finger without damage to nail, initial encounter: Secondary | ICD-10-CM | POA: Diagnosis not present

## 2018-12-13 LAB — SARS CORONAVIRUS 2 (TAT 6-24 HRS): SARS Coronavirus 2: NEGATIVE

## 2018-12-14 ENCOUNTER — Other Ambulatory Visit: Payer: Self-pay | Admitting: Orthopedic Surgery

## 2018-12-15 ENCOUNTER — Ambulatory Visit (HOSPITAL_BASED_OUTPATIENT_CLINIC_OR_DEPARTMENT_OTHER): Payer: Federal, State, Local not specified - PPO | Admitting: Anesthesiology

## 2018-12-15 ENCOUNTER — Encounter (HOSPITAL_BASED_OUTPATIENT_CLINIC_OR_DEPARTMENT_OTHER): Payer: Self-pay | Admitting: *Deleted

## 2018-12-15 ENCOUNTER — Encounter (HOSPITAL_BASED_OUTPATIENT_CLINIC_OR_DEPARTMENT_OTHER)
Admission: RE | Disposition: A | Discharge status: Home / Self Care | Payer: Self-pay | Source: Home / Self Care | Attending: Orthopedic Surgery

## 2018-12-15 ENCOUNTER — Other Ambulatory Visit: Payer: Self-pay

## 2018-12-15 ENCOUNTER — Ambulatory Visit (HOSPITAL_BASED_OUTPATIENT_CLINIC_OR_DEPARTMENT_OTHER)
Admission: RE | Admit: 2018-12-15 | Discharge: 2018-12-15 | Disposition: A | Discharge status: Home / Self Care | Payer: Federal, State, Local not specified - PPO | Attending: Orthopedic Surgery | Admitting: Orthopedic Surgery

## 2018-12-15 DIAGNOSIS — S61213A Laceration without foreign body of left middle finger without damage to nail, initial encounter: Secondary | ICD-10-CM | POA: Insufficient documentation

## 2018-12-15 DIAGNOSIS — F17210 Nicotine dependence, cigarettes, uncomplicated: Secondary | ICD-10-CM | POA: Insufficient documentation

## 2018-12-15 DIAGNOSIS — S61211A Laceration without foreign body of left index finger without damage to nail, initial encounter: Secondary | ICD-10-CM | POA: Diagnosis not present

## 2018-12-15 DIAGNOSIS — W260XXA Contact with knife, initial encounter: Secondary | ICD-10-CM | POA: Diagnosis not present

## 2018-12-15 DIAGNOSIS — G8918 Other acute postprocedural pain: Secondary | ICD-10-CM | POA: Diagnosis not present

## 2018-12-15 DIAGNOSIS — S56122A Laceration of flexor muscle, fascia and tendon of left index finger at forearm level, initial encounter: Secondary | ICD-10-CM | POA: Diagnosis not present

## 2018-12-15 DIAGNOSIS — S56192A Other injury of flexor muscle, fascia and tendon of left index finger at forearm level, initial encounter: Secondary | ICD-10-CM | POA: Diagnosis not present

## 2018-12-15 DIAGNOSIS — S56124A Laceration of flexor muscle, fascia and tendon of left middle finger at forearm level, initial encounter: Secondary | ICD-10-CM | POA: Diagnosis not present

## 2018-12-15 HISTORY — PX: NERVE, TENDON AND ARTERY REPAIR: SHX5695

## 2018-12-15 SURGERY — NERVE, TENDON AND ARTERY REPAIR
Anesthesia: General | Site: Hand | Laterality: Left

## 2018-12-15 MED ORDER — CHLORHEXIDINE GLUCONATE 4 % EX LIQD: 60.0000 mL | Freq: Once | CUTANEOUS | Status: DC

## 2018-12-15 MED ORDER — FENTANYL CITRATE (PF) 100 MCG/2ML IJ SOLN
50.0000 ug | INTRAMUSCULAR | Status: AC | PRN
  Administered 2018-12-15 (×2): 50 ug via INTRAVENOUS
  Administered 2018-12-15: 25 ug via INTRAVENOUS

## 2018-12-15 MED ORDER — DEXAMETHASONE SODIUM PHOSPHATE 10 MG/ML IJ SOLN
INTRAMUSCULAR | Status: DC | PRN
  Administered 2018-12-15: 10 mg via INTRAVENOUS

## 2018-12-15 MED ORDER — FENTANYL CITRATE (PF) 100 MCG/2ML IJ SOLN
INTRAMUSCULAR | Status: AC
  Filled 2018-12-15: qty 2

## 2018-12-15 MED ORDER — DEXAMETHASONE SODIUM PHOSPHATE 10 MG/ML IJ SOLN
INTRAMUSCULAR | Status: AC
  Filled 2018-12-15: qty 1

## 2018-12-15 MED ORDER — ACETAMINOPHEN 325 MG PO TABS: 325.0000 mg | ORAL_TABLET | Freq: Once | ORAL | Status: DC | PRN

## 2018-12-15 MED ORDER — ONDANSETRON HCL 4 MG/2ML IJ SOLN
INTRAMUSCULAR | Status: DC | PRN
  Administered 2018-12-15: 4 mg via INTRAVENOUS

## 2018-12-15 MED ORDER — LIDOCAINE 2% (20 MG/ML) 5 ML SYRINGE
INTRAMUSCULAR | Status: DC | PRN
  Administered 2018-12-15: 40 mg via INTRAVENOUS

## 2018-12-15 MED ORDER — FENTANYL CITRATE (PF) 100 MCG/2ML IJ SOLN: 25.0000 ug | INTRAMUSCULAR | Status: DC | PRN

## 2018-12-15 MED ORDER — EPHEDRINE SULFATE 50 MG/ML IJ SOLN
INTRAMUSCULAR | Status: DC | PRN
  Administered 2018-12-15: 10 mg via INTRAVENOUS

## 2018-12-15 MED ORDER — SCOPOLAMINE 1 MG/3DAYS TD PT72: 1.0000 | MEDICATED_PATCH | Freq: Once | TRANSDERMAL | Status: DC

## 2018-12-15 MED ORDER — OXYCODONE HCL 5 MG PO TABS: 5.0000 mg | ORAL_TABLET | Freq: Once | ORAL | Status: DC | PRN

## 2018-12-15 MED ORDER — PROPOFOL 10 MG/ML IV BOLUS
INTRAVENOUS | Status: DC | PRN
  Administered 2018-12-15: 50 mg via INTRAVENOUS
  Administered 2018-12-15: 250 mg via INTRAVENOUS

## 2018-12-15 MED ORDER — PROPOFOL 10 MG/ML IV BOLUS
INTRAVENOUS | Status: AC
  Filled 2018-12-15: qty 20

## 2018-12-15 MED ORDER — MIDAZOLAM HCL 2 MG/2ML IJ SOLN
INTRAMUSCULAR | Status: AC
  Filled 2018-12-15: qty 2

## 2018-12-15 MED ORDER — PROMETHAZINE HCL 25 MG/ML IJ SOLN: 6.2500 mg | INTRAMUSCULAR | Status: DC | PRN

## 2018-12-15 MED ORDER — ONDANSETRON HCL 4 MG/2ML IJ SOLN
INTRAMUSCULAR | Status: AC
  Filled 2018-12-15: qty 2

## 2018-12-15 MED ORDER — CEFAZOLIN SODIUM-DEXTROSE 2-4 GM/100ML-% IV SOLN
2.0000 g | INTRAVENOUS | Status: AC
  Administered 2018-12-15: 12:00:00 2 g via INTRAVENOUS

## 2018-12-15 MED ORDER — OXYCODONE HCL 5 MG/5ML PO SOLN: 5.0000 mg | Freq: Once | ORAL | Status: DC | PRN

## 2018-12-15 MED ORDER — MIDAZOLAM HCL 2 MG/2ML IJ SOLN
1.0000 mg | INTRAMUSCULAR | Status: DC | PRN
  Administered 2018-12-15: 2 mg via INTRAVENOUS

## 2018-12-15 MED ORDER — ACETAMINOPHEN 10 MG/ML IV SOLN: 1000.0000 mg | Freq: Once | INTRAVENOUS | Status: DC | PRN

## 2018-12-15 MED ORDER — LACTATED RINGERS IV SOLN
INTRAVENOUS | Status: DC
  Administered 2018-12-15 (×2): via INTRAVENOUS

## 2018-12-15 MED ORDER — LIDOCAINE 2% (20 MG/ML) 5 ML SYRINGE
INTRAMUSCULAR | Status: AC
  Filled 2018-12-15: qty 5

## 2018-12-15 MED ORDER — MEPERIDINE HCL 25 MG/ML IJ SOLN: 6.2500 mg | INTRAMUSCULAR | Status: DC | PRN

## 2018-12-15 MED ORDER — BUPIVACAINE-EPINEPHRINE (PF) 0.5% -1:200000 IJ SOLN
INTRAMUSCULAR | Status: DC | PRN
  Administered 2018-12-15: 30 mL via PERINEURAL

## 2018-12-15 MED ORDER — HYDROCODONE-ACETAMINOPHEN 5-325 MG PO TABS: ORAL_TABLET | ORAL | 0 refills | Status: DC

## 2018-12-15 MED ORDER — CEFAZOLIN SODIUM-DEXTROSE 2-4 GM/100ML-% IV SOLN
INTRAVENOUS | Status: AC
  Filled 2018-12-15: qty 100

## 2018-12-15 MED ORDER — ACETAMINOPHEN 160 MG/5ML PO SOLN: 325.0000 mg | Freq: Once | ORAL | Status: DC | PRN

## 2018-12-15 MED ORDER — LACTATED RINGERS IV SOLN: INTRAVENOUS | Status: DC

## 2018-12-15 SURGICAL SUPPLY — 61 items
BAG DECANTER FOR FLEXI CONT (MISCELLANEOUS) IMPLANT
BLADE MINI RND TIP GREEN BEAV (BLADE) IMPLANT
BLADE SURG 15 STRL LF DISP TIS (BLADE) ×2 IMPLANT
BLADE SURG 15 STRL SS (BLADE) ×4
BNDG ELASTIC 3X5.8 VLCR STR LF (GAUZE/BANDAGES/DRESSINGS) ×3 IMPLANT
BNDG ESMARK 4X9 LF (GAUZE/BANDAGES/DRESSINGS) IMPLANT
BNDG GAUZE ELAST 4 BULKY (GAUZE/BANDAGES/DRESSINGS) ×3 IMPLANT
BRUSH SCRUB EZ PLAIN DRY (MISCELLANEOUS) ×3 IMPLANT
CATH ROBINSON RED A/P 10FR (CATHETERS) IMPLANT
CHLORAPREP W/TINT 26 (MISCELLANEOUS) ×3 IMPLANT
CORD BIPOLAR FORCEPS 12FT (ELECTRODE) ×3 IMPLANT
COVER BACK TABLE REUSABLE LG (DRAPES) ×3 IMPLANT
COVER MAYO STAND REUSABLE (DRAPES) ×3 IMPLANT
COVER WAND RF STERILE (DRAPES) IMPLANT
CUFF TOURN SGL QUICK 18X4 (TOURNIQUET CUFF) IMPLANT
DECANTER SPIKE VIAL GLASS SM (MISCELLANEOUS) ×3 IMPLANT
DRAPE EXTREMITY T 121X128X90 (DISPOSABLE) ×3 IMPLANT
DRAPE SURG 17X23 STRL (DRAPES) ×3 IMPLANT
GAUZE SPONGE 4X4 12PLY STRL (GAUZE/BANDAGES/DRESSINGS) ×3 IMPLANT
GAUZE XEROFORM 1X8 LF (GAUZE/BANDAGES/DRESSINGS) ×3 IMPLANT
GLOVE BIO SURGEON STRL SZ7.5 (GLOVE) ×3 IMPLANT
GLOVE BIOGEL PI IND STRL 8 (GLOVE) ×1 IMPLANT
GLOVE BIOGEL PI IND STRL 8.5 (GLOVE) IMPLANT
GLOVE BIOGEL PI INDICATOR 8 (GLOVE) ×2
GLOVE BIOGEL PI INDICATOR 8.5 (GLOVE)
GLOVE SURG ORTHO 8.0 STRL STRW (GLOVE) IMPLANT
GOWN STRL REUS W/ TWL LRG LVL3 (GOWN DISPOSABLE) ×1 IMPLANT
GOWN STRL REUS W/TWL LRG LVL3 (GOWN DISPOSABLE) ×2
GOWN STRL REUS W/TWL XL LVL3 (GOWN DISPOSABLE) ×3 IMPLANT
LOOP VESSEL MAXI BLUE (MISCELLANEOUS) IMPLANT
NDL HYPO 25X1 1.5 SAFETY (NEEDLE) IMPLANT
NDL SAFETY ECLIPSE 18X1.5 (NEEDLE) IMPLANT
NEEDLE HYPO 18GX1.5 SHARP (NEEDLE)
NEEDLE HYPO 25X1 1.5 SAFETY (NEEDLE) IMPLANT
NS IRRIG 1000ML POUR BTL (IV SOLUTION) ×3 IMPLANT
PACK BASIN DAY SURGERY FS (CUSTOM PROCEDURE TRAY) ×3 IMPLANT
PAD CAST 3X4 CTTN HI CHSV (CAST SUPPLIES) ×1 IMPLANT
PAD CAST 4YDX4 CTTN HI CHSV (CAST SUPPLIES) IMPLANT
PADDING CAST ABS 4INX4YD NS (CAST SUPPLIES) ×2
PADDING CAST ABS COTTON 4X4 ST (CAST SUPPLIES) ×1 IMPLANT
PADDING CAST COTTON 3X4 STRL (CAST SUPPLIES) ×2
PADDING CAST COTTON 4X4 STRL (CAST SUPPLIES)
SLEEVE SCD COMPRESS KNEE MED (MISCELLANEOUS) IMPLANT
SPEAR EYE SURG WECK-CEL (MISCELLANEOUS) ×3 IMPLANT
SPLINT PLASTER CAST XFAST 3X15 (CAST SUPPLIES) IMPLANT
SPLINT PLASTER XTRA FASTSET 3X (CAST SUPPLIES)
STOCKINETTE 4X48 STRL (DRAPES) ×3 IMPLANT
SUT ETHIBOND 3-0 V-5 (SUTURE) IMPLANT
SUT ETHILON 4 0 PS 2 18 (SUTURE) ×3 IMPLANT
SUT FIBERWIRE 4-0 18 TAPR NDL (SUTURE)
SUT NYLON 9 0 VRM6 (SUTURE) IMPLANT
SUT PROLENE 6 0 P 1 18 (SUTURE) IMPLANT
SUT SILK 4 0 PS 2 (SUTURE) IMPLANT
SUT SUPRAMID 4-0 (SUTURE) IMPLANT
SUT VICRYL 4-0 PS2 18IN ABS (SUTURE) IMPLANT
SUTURE FIBERWR 4-0 18 TAPR NDL (SUTURE) IMPLANT
SYR BULB 3OZ (MISCELLANEOUS) ×3 IMPLANT
SYR CONTROL 10ML LL (SYRINGE) IMPLANT
TOWEL GREEN STERILE FF (TOWEL DISPOSABLE) ×6 IMPLANT
TRAY DSU PREP LF (CUSTOM PROCEDURE TRAY) IMPLANT
UNDERPAD 30X36 HEAVY ABSORB (UNDERPADS AND DIAPERS) ×3 IMPLANT

## 2018-12-15 NOTE — Transfer of Care (Signed)
Immediate Anesthesia Transfer of Care Note  Patient: Elijah Castillo  Procedure(s) Performed: EXPLORATION REPAIR TENDON LEFT INDEX AND LACERATION LEFT MIDDLE FINGER (Left Hand)  Patient Location: PACU  Anesthesia Type:General and Regional  Level of Consciousness: awake and sedated  Airway & Oxygen Therapy: Patient Spontanous Breathing and Patient connected to face mask oxygen  Post-op Assessment: Report given to RN and Post -op Vital signs reviewed and stable  Post vital signs: Reviewed and stable  Last Vitals:  Vitals Value Taken Time  BP 107/50 12/15/18 1339  Temp    Pulse 60 12/15/18 1342  Resp 19 12/15/18 1342  SpO2 100 % 12/15/18 1342  Vitals shown include unvalidated device data.  Last Pain:  Vitals:   12/15/18 1039  TempSrc: Oral  PainSc: 0-No pain      Patients Stated Pain Goal: 1 (48/01/65 [id number])  Complications: No apparent anesthesia complications

## 2018-12-15 NOTE — Op Note (Signed)
I assisted Surgeon(s) and Role:    * Leanora Cover, MD - Primary    Daryll Brod, MD - Assisting on the Procedure(s): EXPLORATION REPAIR TENDON LEFT INDEX AND LACERATION LEFT MIDDLE FINGER on 12/15/2018.  I provided assistance on this case as follows:setup, approach, exploration of nerves in both fingers, repair of flexor tendon, closure and application of the dressings and splint.  Electronically signed by: Daryll Brod, MD Date: 12/15/2018 Time: 1:36 PM

## 2018-12-15 NOTE — Anesthesia Procedure Notes (Signed)
Anesthesia Regional Block: Supraclavicular block   Pre-Anesthetic Checklist: ,, timeout performed, Correct Patient, Correct Site, Correct Laterality, Correct Procedure, Correct Position, site marked, Risks and benefits discussed,  Surgical consent,  Pre-op evaluation,  At surgeon's request and post-op pain management  Laterality: Left  Prep: chloraprep       Needles:  Injection technique: Single-shot  Needle Type: Echogenic Stimulator Needle     Needle Length: 9cm  Needle Gauge: 21     Additional Needles:   Procedures:,,,, ultrasound used (permanent image in chart),,,,  Narrative:  Start time: 12/15/2018 11:55 AM End time: 12/15/2018 12:05 PM Injection made incrementally with aspirations every 5 mL.  Performed by: Personally  Anesthesiologist: Effie Berkshire, MD  Additional Notes: Patient tolerated the procedure well. Local anesthetic introduced in an incremental fashion under minimal resistance after negative aspirations. No paresthesias were elicited. After completion of the procedure, no acute issues were identified and patient continued to be monitored by RN.

## 2018-12-15 NOTE — Anesthesia Postprocedure Evaluation (Signed)
Anesthesia Post Note  Patient: Elijah Castillo  Procedure(s) Performed: EXPLORATION REPAIR TENDON LEFT INDEX AND LACERATION LEFT MIDDLE FINGER (Left Hand)     Patient location during evaluation: PACU Anesthesia Type: General Level of consciousness: awake and alert Pain management: pain level controlled Vital Signs Assessment: post-procedure vital signs reviewed and stable Respiratory status: spontaneous breathing, nonlabored ventilation, respiratory function stable and patient connected to nasal cannula oxygen Cardiovascular status: blood pressure returned to baseline and stable Postop Assessment: no apparent nausea or vomiting Anesthetic complications: no    Last Vitals:  Vitals:   12/15/18 1424 12/15/18 1456  BP: (!) 168/114 (!) 161/107  Pulse: (!) 52 (!) 52  Resp: 15 18  Temp:  36.4 C  SpO2: 99% 99%    Last Pain:  Vitals:   12/15/18 1445  TempSrc:   PainSc: 0-No pain                 Effie Berkshire

## 2018-12-15 NOTE — Discharge Instructions (Addendum)

## 2018-12-15 NOTE — H&P (Signed)
Elijah Castillo is an 23 y.o. male.   Chief Complaint: left index and long finger lacerations HPI: 23 yo male states he lacerated left index and long fingers on machete while doing yard work 4 days ago.  Unable to flex index finger dip joint.  Decreased sensation in finger tip.  He wishes to have operative exploration with repair of tendon/arteyr/nerve as necessary.  Allergies: No Known Allergies  Past Medical History:  Diagnosis Date  . ADD (attention deficit disorder)     Past Surgical History:  Procedure Laterality Date  . TONSILLECTOMY  2000    Family History: Family History  Problem Relation Age of Onset  . Arthritis Mother   . Hyperlipidemia Mother   . Hypertension Mother   . Alcohol abuse Maternal Grandmother   . Diabetes Maternal Grandmother   . Heart disease Maternal Grandmother   . Diabetes Maternal Grandfather   . Heart disease Maternal Grandfather   . Alcohol abuse Paternal Grandfather     Social History:   reports that he has been smoking cigarettes. He has a 2.64 pack-year smoking history. He has never used smokeless tobacco. He reports current alcohol use. He reports current drug use. Drug: Marijuana.  Medications: Medications Prior to Admission  Medication Sig Dispense Refill  . cephALEXin (KEFLEX) 500 MG capsule Take 1 capsule (500 mg total) by mouth 3 (three) times daily for 7 days. 21 capsule 0    Results for orders placed or performed during the hospital encounter of 12/13/18 (from the past 48 hour(s))  SARS CORONAVIRUS 2 (TAT 6-24 HRS) Nasopharyngeal Nasopharyngeal Swab     Status: None   Collection Time: 12/13/18  1:28 PM   Specimen: Nasopharyngeal Swab  Result Value Ref Range   SARS Coronavirus 2 NEGATIVE NEGATIVE    Comment: (NOTE) SARS-CoV-2 target nucleic acids are NOT DETECTED. The SARS-CoV-2 RNA is generally detectable in upper and lower respiratory specimens during the acute phase of infection. Negative results do not preclude SARS-CoV-2  infection, do not rule out co-infections with other pathogens, and should not be used as the sole basis for treatment or other patient management decisions. Negative results must be combined with clinical observations, patient history, and epidemiological information. The expected result is Negative. Fact Sheet for Patients: SugarRoll.be Fact Sheet for Healthcare Providers: https://www.woods-mathews.com/ This test is not yet approved or cleared by the [place] FDA and  has been authorized for detection and/or diagnosis of SARS-CoV-2 by FDA under an Emergency Use Authorization (EUA). This EUA will remain  in effect (meaning this test can be used) for the duration of the COVID-19 declaration under Section 56 4(b)(1) of the Act, 21 U.S.C. section 360bbb-3(b)(1), unless the authorization is terminated or revoked sooner. Performed at [facility], [place] [address] [place], [place] [address]     No results found.   A comprehensive review of systems was negative.  Blood pressure 127/80, pulse (!) 59, temperature 98.1 F (36.7 C), temperature source Oral, resp. rate 18, height 6\' 4"  (1.93 m), weight 82.5 kg.  General appearance: alert, cooperative and appears stated age Head: Normocephalic, without obvious abnormality, atraumatic Neck: supple, symmetrical, trachea midline Cardio: regular rate and rhythm Resp: clear to auscultation bilaterally Extremities: Intact sensation and capillary refill all digits except decreased sensation in index fingertip.  +epl/fpl/io.  Unable to flex dip of index finger.   Pulses: 2+ and symmetric Skin: Skin color, texture, turgor normal. No rashes or lesions Neurologic: Grossly normal Incision/Wound: laceration volarly at proximal phalanx  index finger and middle phalanx of long finger.  Assessment/Plan Left index and long finger lacerations with possible tendon/artery/nerve lacerations.  Plan operative  exploration with repair tendon/artery/nerve as necessary.  Risks, benefits, and alternatives of surgery have been discussed and the patient agrees with the plan of care.   Betha LoaKevin Lynn Sissel 12/15/2018, 10:57 AM

## 2018-12-15 NOTE — Anesthesia Procedure Notes (Signed)
Procedure Name: LMA Insertion Performed by: Terrance Mass, CRNA Pre-anesthesia Checklist: Patient identified, Emergency Drugs available, Suction available and Patient being monitored Patient Re-evaluated:Patient Re-evaluated prior to induction Oxygen Delivery Method: Circle system utilized Preoxygenation: Pre-oxygenation with 100% oxygen Induction Type: IV induction Ventilation: Mask ventilation without difficulty LMA: LMA inserted LMA Size: 5.0 Number of attempts: 1 Airway Equipment and Method: Bite block Placement Confirmation: positive ETCO2 Tube secured with: Tape Dental Injury: Teeth and Oropharynx as per pre-operative assessment  Comments: Inserted by Lady Deutscher

## 2018-12-15 NOTE — Op Note (Signed)
NAME: KEREM GILMER MEDICAL RECORD NO: [id number] DATE OF BIRTH: 03/30/1996 FACILITY[facility] LOCATION: [facility] PHYSICIAN: Tennis Must, MD   OPERATIVE REPORT   DATE OF PROCEDURE: 12/15/18    PREOPERATIVE DIAGNOSIS:   Left index and long finger lacerations with possible tendon artery nerve laceration   POSTOPERATIVE DIAGNOSIS:   Left index finger FDP laceration and left long finger laceration   PROCEDURE:   1.  Left index finger repair of FDP laceration zone 2 2.  Left long finger exploration and repair of laceration   SURGEON:  Leanora Cover, M.D.   ASSISTANT: Daryll Brod, MD   ANESTHESIA:  General with regional   INTRAVENOUS FLUIDS:  Per anesthesia flow sheet.   ESTIMATED BLOOD LOSS:  Minimal.   COMPLICATIONS:  None.   SPECIMENS:  none   TOURNIQUET TIME:    Total Tourniquet Time Documented: Upper Arm (Left) - 60 minutes Total: Upper Arm (Left) - 60 minutes    DISPOSITION:  Stable to PACU.   INDICATIONS: 23 year old male states he sustained lacerations to the left index and long finger in a machete 4 days ago.  He was seen at the emergency department where the wounds were cleaned and sutured.  He has inability to flex the DIP joint of the index finger.  He wishes to proceed with operative exploration repair as necessary. Risks, benefits and alternatives of surgery were discussed including the risks of blood loss, infection, damage to nerves, vessels, tendons, ligaments, bone for surgery, need for additional surgery, complications with wound healing, continued pain, stiffness.  He voiced understanding of these risks and elected to proceed.  OPERATIVE COURSE:  After being identified preoperatively by myself,  the patient and I agreed on the procedure and site of the procedure.  The surgical site was marked.  Surgical consent had been signed. He was given IV antibiotics as preoperative antibiotic prophylaxis. He was transferred to the operating room and  placed on the operating table in supine position with the Left upper extremity on an arm board.  General anesthesia was induced by the anesthesiologist. A regional block had been performed by anesthesia in preoperative holding.   Left upper extremity was prepped and draped in normal sterile orthopedic fashion.  A surgical pause was performed between the surgeons, anesthesia, and operating room staff and all were in agreement as to the patient, procedure, and site of procedure.  Tourniquet at the proximal aspect of the extremity was inflated to 250 mmHg after exsanguination of the arm with an Esmarch bandage.    Sutures were removed from the wound.  The long finger wound was explored first.  This went into the subcutaneous tissues.  The radial and ulnar neurovascular bundles were identified and were intact.  The laceration did not go down to the flexor tendon sheath.  Wound was irrigated with sterile saline and closed with 4-0 nylon in a horizontal mattress fashion.  The index finger wound was then explored.  It was extended both proximally and distally in a Drexel fashion to aid in visualization.  The radial and ulnar neurovascular bundles were identified and were intact.  There was laceration of the FDP tendon distal to the A4 pulley.  Laceration was distal to the FDS tendon.  The A5 pulley was released to aid in visualization of the distal stump.  The proximal stump was able to be retrieved.  A 4 oh looped Supramid suture was then passed in a modified Kessler technique through the tendon.  A running 6-0 Prolene epitendinous suture was passed as well.  Good reapproximation of the tendon ends was achieved without bunching.  There was no gap.  The A4 pulley was intact.  The wound was copiously irrigated with sterile saline and closed with 4-0 nylon in a horizontal mattress fashion.  The wounds were dressed with sterile Xeroform 4 x 4's and wrapped with a Kerlix bandage.  Dorsal blocking splint was placed with the  wrist at 30 to 40 degrees flexion the MPs flexed and the IP is extended.  This was wrapped with Kerlix and Ace bandage.  The tourniquet was deflated at 60 minutes.  Fingertips were pink with brisk capillary refill after deflation of tourniquet.  The operative  drapes were broken down.  The patient was awoken from anesthesia safely.  He was transferred back to the stretcher and taken to PACU in stable condition.  I will see him back in the office in 1 week for postoperative followup.  I will give him a prescription for Norco 5/325 1-2 tabs PO q6 hours prn pain, dispense # 30.   Betha LoaKevin Taige Housman, MD Electronically signed, 12/15/18

## 2018-12-15 NOTE — Anesthesia Preprocedure Evaluation (Signed)
Anesthesia Evaluation  Patient identified by MRN, date of birth, ID band Patient awake    Reviewed: Allergy & Precautions, NPO status , Patient's Chart, lab work & pertinent test results  Airway Mallampati: I  TM Distance: >3 FB Neck ROM: Full    Dental no notable dental hx.    Pulmonary Current Smoker,    Pulmonary exam normal        Cardiovascular negative cardio ROS Normal cardiovascular exam     Neuro/Psych negative neurological ROS     GI/Hepatic negative GI ROS, Neg liver ROS,   Endo/Other  negative endocrine ROS  Renal/GU negative Renal ROS     Musculoskeletal negative musculoskeletal ROS (+)   Abdominal Normal abdominal exam  (+)   Peds  Hematology negative hematology ROS (+)   Anesthesia Other Findings   Reproductive/Obstetrics                             Anesthesia Physical Anesthesia Plan  ASA: II  Anesthesia Plan: General   Post-op Pain Management: GA combined w/ Regional for post-op pain   Induction: Intravenous  PONV Risk Score and Plan: 2 and Ondansetron, Dexamethasone and Midazolam  Airway Management Planned: LMA  Additional Equipment: None  Intra-op Plan:   Post-operative Plan: Extubation in OR  Informed Consent: I have reviewed the patients History and Physical, chart, labs and discussed the procedure including the risks, benefits and alternatives for the proposed anesthesia with the patient or authorized representative who has indicated his/her understanding and acceptance.     Dental advisory given  Plan Discussed with: CRNA  Anesthesia Plan Comments:         Anesthesia Quick Evaluation

## 2018-12-19 ENCOUNTER — Encounter (HOSPITAL_BASED_OUTPATIENT_CLINIC_OR_DEPARTMENT_OTHER): Payer: Self-pay | Admitting: Orthopedic Surgery

## 2018-12-23 DIAGNOSIS — S66121D Laceration of flexor muscle, fascia and tendon of left index finger at wrist and hand level, subsequent encounter: Secondary | ICD-10-CM | POA: Diagnosis not present

## 2018-12-23 DIAGNOSIS — S61213D Laceration without foreign body of left middle finger without damage to nail, subsequent encounter: Secondary | ICD-10-CM | POA: Diagnosis not present

## 2018-12-23 DIAGNOSIS — R29898 Other symptoms and signs involving the musculoskeletal system: Secondary | ICD-10-CM | POA: Diagnosis not present

## 2018-12-23 DIAGNOSIS — M79642 Pain in left hand: Secondary | ICD-10-CM | POA: Diagnosis not present

## 2018-12-23 DIAGNOSIS — M25642 Stiffness of left hand, not elsewhere classified: Secondary | ICD-10-CM | POA: Diagnosis not present

## 2018-12-28 ENCOUNTER — Other Ambulatory Visit: Payer: Self-pay

## 2018-12-28 ENCOUNTER — Emergency Department (HOSPITAL_COMMUNITY)
Admission: EM | Admit: 2018-12-28 | Discharge: 2018-12-29 | Disposition: A | Discharge status: Home / Self Care | Payer: Federal, State, Local not specified - PPO | Attending: Emergency Medicine | Admitting: Emergency Medicine

## 2018-12-28 DIAGNOSIS — Z638 Other specified problems related to primary support group: Secondary | ICD-10-CM | POA: Diagnosis not present

## 2018-12-28 DIAGNOSIS — F17210 Nicotine dependence, cigarettes, uncomplicated: Secondary | ICD-10-CM | POA: Diagnosis not present

## 2018-12-28 DIAGNOSIS — F909 Attention-deficit hyperactivity disorder, unspecified type: Secondary | ICD-10-CM | POA: Diagnosis not present

## 2018-12-28 DIAGNOSIS — Z046 Encounter for general psychiatric examination, requested by authority: Secondary | ICD-10-CM | POA: Insufficient documentation

## 2018-12-28 DIAGNOSIS — F333 Major depressive disorder, recurrent, severe with psychotic symptoms: Secondary | ICD-10-CM | POA: Diagnosis not present

## 2018-12-28 DIAGNOSIS — F988 Other specified behavioral and emotional disorders with onset usually occurring in childhood and adolescence: Secondary | ICD-10-CM | POA: Diagnosis present

## 2018-12-28 DIAGNOSIS — R451 Restlessness and agitation: Secondary | ICD-10-CM | POA: Diagnosis not present

## 2018-12-28 DIAGNOSIS — Z008 Encounter for other general examination: Secondary | ICD-10-CM

## 2018-12-28 NOTE — ED Notes (Signed)
Pt reading from the [facility].
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
## 2018-12-28 NOTE — ED Notes (Signed)
TTS assessing him now via tele psych

## 2018-12-28 NOTE — ED Triage Notes (Signed)
Pt to room 29. Pt brought in by sheriff. IVDd   Pt agitated. Pt stated here because of argument

## 2018-12-28 NOTE — ED Notes (Signed)
On initial assessment he is agitated, verbally aggressive, insistent he is not staying here and he will become violent if he is made to stay here. He states he needs to leave here for the safety of his children. He reiterates this position over and over again, and states he is at risk of getting COVID virus while he is here, and he is going to walk out of this place. Dr Sedonia Small on the unit while writer speaking with him and came in to assess him. Ordered a TTS evaluation which is what is being waited on now.
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
## 2018-12-29 ENCOUNTER — Encounter (HOSPITAL_COMMUNITY): Payer: Self-pay | Admitting: Registered Nurse

## 2018-12-29 DIAGNOSIS — Z638 Other specified problems related to primary support group: Secondary | ICD-10-CM | POA: Diagnosis not present

## 2018-12-29 DIAGNOSIS — F909 Attention-deficit hyperactivity disorder, unspecified type: Secondary | ICD-10-CM

## 2018-12-29 NOTE — ED Notes (Signed)
Pt DCd to home per MD. Pt alert, calm, cooperative, no s/s of distress.  DC information given to and reviewed with pt, with acknowledged understanding. Belongings given to pt. Pt ambulatory off unit, escorted by [facility]. Per pt, using bus.
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
## 2018-12-29 NOTE — BH Assessment (Signed)
[facility] Assessment Progress Note  Per Buford Dresser, DO, this pt does not require psychiatric hospitalization at this time.  Pt presents under IVC initiated by pt's mother, and upheld by EDP Gerlene Fee, MD, which Dr Mariea Clonts has rescinded.  Pt is to be discharged from [facility] with recommendation to follow up with an outpatient psychiatrist.  Several options have been included in pt's discharge instructions.  Pt's nurse, Eustaquio Maize, has been notified.  Jalene Mullet, [facility] Triage Specialist [phone]

## 2018-12-29 NOTE — Discharge Instructions (Signed)
For your behavioral health needs, you are advised to follow up with an outpatient psychiatrist.  Contact one of the following providers at your earliest opportunity to ask about scheduling an intake appointment: ° °     [facility] [place] °     [address] °     [place], [place] [address] °     [phone] ° °     [facility] °     [address] °     [place], [place] [address] °     [phone] ° °     [facility] °     [address] °     [place], [place] [address] °     [phone] °
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
## 2021-01-16 ENCOUNTER — Emergency Department (HOSPITAL_COMMUNITY)
Admission: EM | Admit: 2021-01-16 | Discharge: 2021-01-17 | Disposition: A | Discharge status: Other Acute Inpatient Hospital | Payer: Federal, State, Local not specified - PPO | Attending: Emergency Medicine | Admitting: Emergency Medicine

## 2021-01-16 DIAGNOSIS — Z20822 Contact with and (suspected) exposure to covid-19: Secondary | ICD-10-CM | POA: Insufficient documentation

## 2021-01-16 DIAGNOSIS — F17210 Nicotine dependence, cigarettes, uncomplicated: Secondary | ICD-10-CM | POA: Insufficient documentation

## 2021-01-16 DIAGNOSIS — Y900 Blood alcohol level of less than 20 mg/100 ml: Secondary | ICD-10-CM | POA: Insufficient documentation

## 2021-01-16 DIAGNOSIS — F29 Unspecified psychosis not due to a substance or known physiological condition: Secondary | ICD-10-CM | POA: Insufficient documentation

## 2021-01-16 LAB — CBC WITH DIFFERENTIAL/PLATELET
Abs Immature Granulocytes: 0.01 10*3/uL (ref 0.00–0.07)
Basophils Absolute: 0 10*3/uL (ref 0.0–0.1)
Basophils Relative: 0 %
Eosinophils Absolute: 0 10*3/uL (ref 0.0–0.5)
Eosinophils Relative: 0 %
HCT: 41.7 % (ref 39.0–52.0)
Hemoglobin: 14.3 g/dL (ref 13.0–17.0)
Immature Granulocytes: 0 %
Lymphocytes Relative: 12 %
Lymphs Abs: 0.9 10*3/uL (ref 0.7–4.0)
MCH: 31.2 pg (ref 26.0–34.0)
MCHC: 34.3 g/dL (ref 30.0–36.0)
MCV: 91 fL (ref 80.0–100.0)
Monocytes Absolute: 0.7 10*3/uL (ref 0.1–1.0)
Monocytes Relative: 9 %
Neutro Abs: 5.8 10*3/uL (ref 1.7–7.7)
Neutrophils Relative %: 79 %
Platelets: 226 10*3/uL (ref 150–400)
RBC: 4.58 MIL/uL (ref 4.22–5.81)
RDW: 13.5 % (ref 11.5–15.5)
WBC: 7.4 10*3/uL (ref 4.0–10.5)
nRBC: 0 % (ref 0.0–0.2)

## 2021-01-16 LAB — COMPREHENSIVE METABOLIC PANEL
ALT: 19 U/L (ref 0–44)
AST: 32 U/L (ref 15–41)
Albumin: 5.1 g/dL — ABNORMAL HIGH (ref 3.5–5.0)
Alkaline Phosphatase: 44 U/L (ref 38–126)
Anion gap: 13 (ref 5–15)
BUN: 16 mg/dL (ref 6–20)
CO2: 22 mmol/L (ref 22–32)
Calcium: 9.7 mg/dL (ref 8.9–10.3)
Chloride: 100 mmol/L (ref 98–111)
Creatinine, Ser: 1.65 mg/dL — ABNORMAL HIGH (ref 0.61–1.24)
GFR, Estimated: 58 mL/min — ABNORMAL LOW (ref >60)
Glucose, Bld: 87 mg/dL (ref 70–99)
Potassium: 3.6 mmol/L (ref 3.5–5.1)
Sodium: 135 mmol/L (ref 135–145)
Total Bilirubin: 1.4 mg/dL — ABNORMAL HIGH (ref 0.3–1.2)
Total Protein: 9.2 g/dL — ABNORMAL HIGH (ref 6.5–8.1)

## 2021-01-16 LAB — RESP PANEL BY RT-PCR (FLU A&B, COVID) ARPGX2
Influenza A by PCR: NEGATIVE
Influenza B by PCR: NEGATIVE
SARS Coronavirus 2 by RT PCR: NEGATIVE

## 2021-01-16 LAB — ETHANOL: Alcohol, Ethyl (B): <10 mg/dL (ref <10)

## 2021-01-16 MED ORDER — LACTATED RINGERS IV BOLUS
1000.0000 mL | Freq: Once | INTRAVENOUS | Status: AC
  Administered 2021-01-16: 1000 mL via INTRAVENOUS

## 2021-01-16 MED ORDER — ACETAMINOPHEN 325 MG PO TABS: 650.0000 mg | ORAL_TABLET | ORAL | Status: DC | PRN

## 2021-01-16 MED ORDER — LORAZEPAM 1 MG PO TABS
1.0000 mg | ORAL_TABLET | ORAL | Status: AC | PRN
  Administered 2021-01-16: 1 mg via ORAL
  Filled 2021-01-16: qty 1

## 2021-01-16 MED ORDER — LORAZEPAM 2 MG/ML IJ SOLN: 2.0000 mg | Freq: Once | INTRAMUSCULAR | Status: DC

## 2021-01-16 MED ORDER — ZOLPIDEM TARTRATE 5 MG PO TABS: 5.0000 mg | ORAL_TABLET | Freq: Every evening | ORAL | Status: DC | PRN

## 2021-01-16 MED ORDER — ZIPRASIDONE MESYLATE 20 MG IM SOLR
20.0000 mg | INTRAMUSCULAR | Status: AC | PRN
  Administered 2021-01-16: 20 mg via INTRAMUSCULAR
  Filled 2021-01-16: qty 20

## 2021-01-16 MED ORDER — ONDANSETRON HCL 4 MG PO TABS
4.0000 mg | ORAL_TABLET | Freq: Three times a day (TID) | ORAL | Status: DC | PRN
Start: 2021-01-16 — End: 2021-01-17

## 2021-01-16 MED ORDER — LACTATED RINGERS IV SOLN
INTRAVENOUS | Status: DC
  Administered 2021-01-16: 125 mL/h via INTRAVENOUS

## 2021-01-16 MED ORDER — NICOTINE 21 MG/24HR TD PT24
21.0000 mg | MEDICATED_PATCH | Freq: Every day | TRANSDERMAL | Status: DC
  Filled 2021-01-16: qty 1

## 2021-01-16 MED ORDER — ALUM & MAG HYDROXIDE-SIMETH 200-200-20 MG/5ML PO SUSP: 30.0000 mL | Freq: Four times a day (QID) | ORAL | Status: DC | PRN

## 2021-01-16 MED ORDER — LORAZEPAM 1 MG PO TABS
2.0000 mg | ORAL_TABLET | Freq: Once | ORAL | Status: DC
  Administered 2021-01-16: 2 mg via ORAL

## 2021-01-16 MED ORDER — RISPERIDONE 0.5 MG PO TBDP
2.0000 mg | ORAL_TABLET | Freq: Three times a day (TID) | ORAL | Status: DC | PRN
  Administered 2021-01-16: 2 mg via ORAL
  Filled 2021-01-16: qty 4

## 2021-01-16 NOTE — ED Notes (Addendum)
Attempting to triage pt, but he is unable to answer questions. Pt affect is hostile and uncooperative. Pt handcuffed and with 3 [employer] officers. Pt is standing, leaning toward [employer] officers, and threatening officers with violence. He is exhibiting magical thinking, grandiose delusions, and hyper religiosity. His speech is pressured, rapid, excessively loud, and compulsive. Pt makes statements such as, "I am a God. I am on a light mission. This is against my religion. I expose shit. I told you, I am the law. I can have you detained, but it's in a different type of jail. I got power over the devil. I'll snatch your brains through your asshole. You play like you don't see the light. You are scared...Marland Kitchen"

## 2021-01-16 NOTE — ED Provider Notes (Signed)
[facility]-EMERGENCY DEPT Provider Note   CSN: [id number] Arrival date & time: 01/16/21  1049     History Chief Complaint  Patient presents with   Delusional    Elijah Castillo is a 25 y.o. male.  The history is provided by the patient. No language interpreter was used.   25 year old male recently history of ADD brought here accompanied by [employer] from home with IVC paper due to aggressive behaviors.  Per IVC paperwork, family member noticed that the patient was acting erratically and aggressively towards other members as well as patient thinks everyone is out to get him, thinking that he is God and believes that he is currently married with children's.  He refused help.  He is not on any medication. Unfortunately unable to obtain additional history as patient is currently noncompliance, yelling at staff.  Level 5 caveat is due to psychiatric illness.  Past Medical History:  Diagnosis Date   ADD (attention deficit disorder)     Patient Active Problem List   Diagnosis Date Noted   Family discord 12/29/2018   ADD (attention deficit disorder) 10/15/2015    Past Surgical History:  Procedure Laterality Date   NERVE, TENDON AND ARTERY REPAIR Left 12/15/2018   Procedure: EXPLORATION REPAIR TENDON LEFT INDEX AND LACERATION LEFT MIDDLE FINGER;  Surgeon: Betha Loa, MD;  Location: [facility];  Service: Orthopedics;  Laterality: Left;   TONSILLECTOMY  2000       Family History  Problem Relation Age of Onset   Arthritis Mother    Hyperlipidemia Mother    Hypertension Mother    Alcohol abuse Maternal Grandmother    Diabetes Maternal Grandmother    Heart disease Maternal Grandmother    Diabetes Maternal Grandfather    Heart disease Maternal Grandfather    Alcohol abuse Paternal Grandfather     Social History   Tobacco Use   Smoking status: Every Day    Packs/day: 0.33    Years: 8.00    Pack years: 2.64    Types: Cigarettes    Smokeless tobacco: Never  Vaping Use   Vaping Use: Never used  Substance Use Topics   Alcohol use: Yes    Alcohol/week: 0.0 standard drinks    Comment: rare--beer   Drug use: Yes    Types: Marijuana    Home Medications Prior to Admission medications   Not on File    Allergies    Patient has no known allergies.  Review of Systems   Review of Systems  Unable to perform ROS: Psychiatric disorder   Physical Exam Updated Vital Signs BP (!) 148/84 (BP Location: Right Arm)   Pulse 82   Temp 97.7 F (36.5 C) (Oral)   Resp 17   SpO2 100%   Physical Exam Vitals and nursing note reviewed.  Constitutional:      Appearance: He is well-developed.     Comments: Patient is sitting in bed, with handcuffs in place, verbally aggressive  HENT:     Head: Atraumatic.  Eyes:     Conjunctiva/sclera: Conjunctivae normal.  Musculoskeletal:     Cervical back: Neck supple.  Skin:    Findings: No rash.  Neurological:     Mental Status: He is alert.     GCS: GCS eye subscore is 4. GCS verbal subscore is 5. GCS motor subscore is 6.  Psychiatric:        Mood and Affect: Affect is angry.        Speech: Speech  is rapid and pressured.        Behavior: Behavior is uncooperative.        Thought Content: Thought content does not include homicidal or suicidal ideation.        Judgment: Judgment is inappropriate.    ED Results / Procedures / Treatments   Labs (all labs ordered are listed, but only abnormal results are displayed) Labs Reviewed  COMPREHENSIVE METABOLIC PANEL - Abnormal; Notable for the following components:      Result Value   Creatinine, Ser 1.65 (*)    Total Protein 9.2 (*)    Albumin 5.1 (*)    Total Bilirubin 1.4 (*)    GFR, Estimated 58 (*)    All other components within normal limits  RESP PANEL BY RT-PCR (FLU A&B, COVID) ARPGX2  CBC WITH DIFFERENTIAL/PLATELET  ETHANOL  RAPID URINE DRUG SCREEN, HOSP PERFORMED    EKG None  Radiology No results  found.  Procedures Procedures   Medications Ordered in ED Medications  risperiDONE (RISPERDAL M-TABS) disintegrating tablet 2 mg (2 mg Oral Patient Refused/Not Given 01/16/21 1128)    And  LORazepam (ATIVAN) tablet 1 mg (1 mg Oral Given 01/16/21 1128)    And  ziprasidone (GEODON) injection 20 mg (20 mg Intramuscular Given 01/16/21 1126)  acetaminophen (TYLENOL) tablet 650 mg (has no administration in time range)  zolpidem (AMBIEN) tablet 5 mg (has no administration in time range)  ondansetron (ZOFRAN) tablet 4 mg (has no administration in time range)  alum & mag hydroxide-simeth (MAALOX/MYLANTA) 200-200-20 MG/5ML suspension 30 mL (has no administration in time range)  nicotine (NICODERM CQ - dosed in mg/24 hours) patch 21 mg (has no administration in time range)    ED Course  I have reviewed the triage vital signs and the nursing notes.  Pertinent labs & imaging results that were available during my care of the patient were reviewed by me and considered in my medical decision making (see chart for details).    MDM Rules/Calculators/A&P                           BP (!) 148/84 (BP Location: Right Arm)   Pulse 82   Temp 97.7 F (36.5 C) (Oral)   Resp 17   SpO2 100%   Final Clinical Impression(s) / ED Diagnoses Final diagnoses:  Psychosis, unspecified psychosis type (HCC)    Rx / DC Orders ED Discharge Orders     None      11:17 AM Patient brought here with IVC paper due to aggressive behavior and psychosis.  No prior diagnosis of bipolar.  Does have history of anxiety.  He appears manic on exam, with pressured speech, and non cooperative.  Patient will likely benefit from chemical restraints for the safety of the patient and staff.  Will perform first exam and will consult psychiatry for further managements of his psychiatric illness.  1:56 PM Labs show AKI, likely 2/2 his manic state.  Pt otherwise medically cleared and can be assess further by psychiatry.    Fayrene Helper, PA-C 01/16/21 1519    Tegeler, Canary Brim, MD 01/16/21 [phone]
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
## 2021-01-16 NOTE — ED Notes (Signed)
Pt belongings placed in locker #29. Pt dressed in burgundy scrubs. Pt refuses to remove necklace.
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
## 2021-01-16 NOTE — Progress Notes (Signed)
Inpatient Behavioral Health Placement  Pt meets inpatient criteria per Karel Jarvis, PA. Per [facility] Rosey Bath, RN there are not appropriate beds at [facility].  Referral was sent to the following out of network facilities:  Destination Service Provider Address Phone Fax  [facility]  [address] [place] [address] [phone]  [facility]  [address], [place] [address] [phone]  [facility]  [address] [place] [address] [phone]  [facility]  [address] [place] [address] [phone]  [facility]  [address], [place] [address] [phone]  [facility]  [phone] [address] [place] [address] [phone]  [facility]  [address] [place] [address] [phone]  [facility]  [address], [place] [address] [phone]  [facility]  [address] [place] [address] [phone]  [facility]  [address] [place] [address] [phone]  [facility]  [address] [place], [place] [address] [phone]  [facility]  [address], [place] [address] [phone]    Situation ongoing,  CSW will follow up.   Maryjean Ka, MSW, [facility] 01/16/2021  @ 11:43 PM

## 2021-01-16 NOTE — ED Notes (Signed)
Pt cooperative with vitals. Pt refuses to remove his necklace stating, "It's against my religion."

## 2021-01-17 LAB — BASIC METABOLIC PANEL
Anion gap: 6 (ref 5–15)
BUN: 16 mg/dL (ref 6–20)
CO2: 26 mmol/L (ref 22–32)
Calcium: 9 mg/dL (ref 8.9–10.3)
Chloride: 107 mmol/L (ref 98–111)
Creatinine, Ser: 1.13 mg/dL (ref 0.61–1.24)
GFR, Estimated: >60 mL/min (ref >60)
Glucose, Bld: 93 mg/dL (ref 70–99)
Potassium: 4 mmol/L (ref 3.5–5.1)
Sodium: 139 mmol/L (ref 135–145)

## 2021-01-17 MED ORDER — ZIPRASIDONE MESYLATE 20 MG IM SOLR: 20.0000 mg | INTRAMUSCULAR | Status: DC | PRN

## 2021-01-17 MED ORDER — OLANZAPINE 10 MG PO TBDP: 10.0000 mg | ORAL_TABLET | Freq: Every day | ORAL | Status: DC

## 2021-01-17 MED ORDER — LORAZEPAM 1 MG PO TABS: 1.0000 mg | ORAL_TABLET | ORAL | Status: DC | PRN

## 2021-01-17 MED ORDER — OLANZAPINE 10 MG PO TBDP: 10.0000 mg | ORAL_TABLET | Freq: Three times a day (TID) | ORAL | Status: DC | PRN

## 2021-01-17 NOTE — BH Assessment (Addendum)
BHH Assessment Progress Note   Per Karel Jarvis, PA, this pt requires psychiatric hospitalization at this time.  Pt presents under IVC initiated by pt's father and upheld by EDP Lynden Oxford, MD. At 15:07 Broadus John calls from [facility] to report that pt has been accepted to their Delta Unit by Dr Margarita Rana.  Maxie Barb, NP concurs with this decision.  EDP's Norman Clay, MD and Vanetta Mulders, MD, as well as pt's nurse, Addison Naegeli, have been notified, and Addison Naegeli agrees to call report to [phone].  Pt is to be transported via [facility].  Doylene Canning Behavioral Health Coordinator [phone]

## 2021-01-17 NOTE — Progress Notes (Signed)
01/17/2021  1107 EKG completed. Labs drawn and sent to main lab. Light green, dark green, and purple tubes sent.

## 2021-01-17 NOTE — ED Provider Notes (Signed)
Emergency Medicine Observation Re-evaluation Note  Elijah Castillo is a 25 y.o. male, seen on rounds today.  Pt initially presented to the ED for complaints of Delusional Currently, the patient is awake, alert, intermittent episodes of agitation  Physical Exam  BP 124/74 (BP Location: Right Arm)   Pulse 76   Temp 98.5 F (36.9 C) (Oral)   Resp 20   SpO2 98%  Physical Exam General: No acute distress Cardiac: Regular rate and rhythm Lungs: Normal breathing rate, no tachypnea Psych: Continued delusional expressions  ED Course / MDM  EKG:   I have reviewed the labs performed to date as well as medications administered while in observation.  Plan  Current plan is for inpatient psychiatric placement.  Elijah Castillo is under involuntary commitment.      Cheryll Cockayne, MD 01/17/21 [phone]

## 2021-01-17 NOTE — Progress Notes (Signed)
01/17/2021  1620  Sheriff called back and is able to transport patient. Called [facility] and gave report to [place].

## 2021-01-17 NOTE — Progress Notes (Signed)
01/17/2021  Called Sheriff [phone] Left message that patient needed to be transported to [facility] called back stating they will be available to take patient tomorrow 10/22 morning after 8am.

## 2021-01-17 NOTE — ED Provider Notes (Signed)
Patient excepted for inpatient psychiatric care by [facility] delta unit Dr. Guss Bunde.  Patient is IVC will be transported by [facility].   Vanetta Mulders, MD 01/17/21 [phone]

## 2021-01-17 NOTE — Progress Notes (Signed)
01/17/2021  1540  Called [facility] [phone] informed them that [facility] would not be available tonight to transport patient, but will transport patient in the morning. Per [name] patient bed will still be available for [facility] to bring patient on 10/22 after 8am.

## 2021-01-17 NOTE — BH Assessment (Signed)
BHH Assessment Progress Note   Per Karel Jarvis, PA, this pt requires psychiatric hospitalization at this time.  Pt presents under IVC initiated by pt's father and upheld by EDP Lynden Oxford, MD.  [facility] is currently at capacity.  The following facilities have been contacted to seek placement for this pt, with results as noted:  Beds available, information sent, decision pending: [facility]nn Donalda [place] [place]  At capacity: [facility]   Doylene Canning, [place] Behavioral Health Coordinator [phone]

## 2021-01-29 ENCOUNTER — Other Ambulatory Visit: Payer: Self-pay

## 2021-01-29 ENCOUNTER — Ambulatory Visit (INDEPENDENT_AMBULATORY_CARE_PROVIDER_SITE_OTHER): Payer: No Payment, Other | Admitting: Physician Assistant

## 2021-01-29 ENCOUNTER — Encounter (HOSPITAL_COMMUNITY): Payer: Self-pay | Admitting: Physician Assistant

## 2021-01-29 VITALS — HR 95 | Ht 74.0 in | Wt 182.0 lb

## 2021-01-29 DIAGNOSIS — F259 Schizoaffective disorder, unspecified: Secondary | ICD-10-CM

## 2021-01-29 DIAGNOSIS — F5105 Insomnia due to other mental disorder: Secondary | ICD-10-CM | POA: Diagnosis not present

## 2021-01-29 DIAGNOSIS — F99 Mental disorder, not otherwise specified: Secondary | ICD-10-CM | POA: Diagnosis not present

## 2021-01-29 MED ORDER — TRAZODONE HCL 50 MG PO TABS
50.0000 mg | ORAL_TABLET | Freq: Every day | ORAL | 1 refills | Status: AC
  Filled 2021-01-29: qty 30, 30d supply, fill #0

## 2021-01-29 MED ORDER — PALIPERIDONE ER 6 MG PO TB24
6.0000 mg | ORAL_TABLET | Freq: Every day | ORAL | 1 refills | Status: AC
Start: 2021-01-29 — End: ?
  Filled 2021-01-29: qty 30, 30d supply, fill #0

## 2021-01-29 NOTE — Progress Notes (Signed)
Psychiatric Initial Adult Assessment   Patient Identification: Elijah Castillo MRN:  [id number] Date of Evaluation:  01/29/2021 Referral Source: Referral by [facility] Chief Complaint:   Chief Complaint   Medication Management    Visit Diagnosis:    ICD-10-CM   1. Insomnia due to other mental disorder  F51.05 traZODone (DESYREL) 50 MG tablet   F99     2. Schizoaffective disorder, unspecified type (HCC)  F25.9 paliperidone (INVEGA) 6 MG 24 hr tablet      History of Present Illness:    Elijah Castillo is a 25 year old male with a past psychiatric history significant for schizoaffective disorder and insomnia who presents to [name] for medication management.  Patient was asked the reason for the visit today which he replied it was a required appointment from his nurse after being discharged from [name].    When patient was asked the reason for his admission to [name], patient replied "My mental state went through the roof and I could not handle it. I couldn't contain it.  Patient states that he was IVC and admitted to [name] for roughly 7 days.  Patient states that he was not discharged on any medications but states that he was recommended taking medications for his mental health.  Patient believes that he does not need to be on any medications and has no interest in being placed on them.  He states that he has no interest in medications unless he produces the medication himself.  [name] was brought into the encounter for collateral.  Per patient's mother, patient was [name] and admitted to [facility] after worsening of his mental state.  [name] states that the patient is prone to having outbursts and getting really angry.  She states that he is paranoid at times and is often jumpy.  Patient denied being paranoid, however, patient's mother noted that the patient has engaged in the following  behaviors: applying tint to his car out of the blue, being suspicious of her neighbors in the neighborhood, and ranting/raving about people out to get him.  Patient interjected stating that "he will get them before they get him.  [name] states that the patient believes that he is a prophet that believes that everyone should be below him.  She states that the patient has stated in the past that he receives messages that people are out to get him.  [name] states that she just wants him to function.  Patient was discharged from [facility] with a prescription for the following medications: Trazodone 50 mg at bedtime and Invega 6 mg at bedtime.  [name] states that they were not able to receive the medication due to the cost.  Patient has currently not been on any medications and has no insurance.  She states that the patient has filled out an Agricultural consultant for Medicaid.  Patient denies depressive symptoms.  He explained to provider that the main cause of depression is due to being pressed in a downward state by the devil.  She denies anxiety.  When asked about auditory hallucinations patient replied by saying, "There are things around you, there is breath around you and angelic forces as well as demonic forces.  I have cured people and I have done so many exorcisms.  I have woke them up."  Patient denies a history of suicide attempt.  Patient denies a past history of self-harm.  Patient does endorse having PTSD stating "  I have seen a lot of shit."  When asked what are some of the things that he has seen, patient replied "I can't even tell you."  Patient is alert and oriented x4, cooperative, and engaged in conversation during the encounter.  Patient is extremely restless on exam and is often jumpy when questions are asked to him.  Patient denies suicidal or homicidal ideations.  He further denies auditory or visual hallucinations but states that he answers to Allah.  Patient  does not appear to be responding to internal/external stimuli.  Patient endorses poor sleep stating that he really does not sleep.  [name] states that he has been up for roughly 3 to 4 days.  He will occasionally nap 1 - 2 hours during the day but is often up most of the night.  Patient endorses decreased appetite stating that he eats 1 meal or less per day.  Patient endorses alcohol consumption stating that he drinks beer on occasion.  Patient endorses tobacco use and smokes on average 4 more cigarettes per day.  Patient endorses illicit drug use in the form of marijuana.  Patient states that he smokes marijuana every day stating that it is essential like toothpaste for the soul.  Patient reports that he used ecstasy in the past.  Associated Signs/Symptoms: Depression Symptoms:  insomnia, psychomotor agitation, difficulty concentrating, impaired memory, disturbed sleep, decreased appetite, (Hypo) Manic Symptoms:  Delusions, Distractibility, Elevated Mood, Grandiosity, Hallucinations, Irritable Mood, Labiality of Mood, Anxiety Symptoms:  Obsessive Compulsive Symptoms:   Checking,, Social Anxiety, Specific Phobias, Psychotic Symptoms:  Delusions, Hallucinations: Auditory Paranoia, PTSD Symptoms: Had a traumatic exposure:  Patient states that he has some traumatic experience Had a traumatic exposure in the last month:  N/A Re-experiencing:  Flashbacks Hypervigilance:  Yes Hyperarousal:  Difficulty Concentrating Increased Startle Response Irritability/Anger Avoidance:  Decreased Interest/Participation Foreshortened Future  Past Psychiatric History:  Patient reports that he has a past psychiatric history significant for PTSD Per mother, patient was diagnosed with schizophrenia/schizoaffective disorder during his admission to [facility] Patient denied a past history of psychiatric history stating that he has no psychiatric history from [place].  Previous Psychotropic  Medications: Yes   Substance Abuse History in the last 12 months:  Yes.    Consequences of Substance Abuse: Medical Consequences:  Patient reports that he overdosed on vyvanse and alcohol in the past. [name] interjected stating that he never overdosed on Adderall. Legal Consequences:  Patient denies legal consequences from drug use Family Consequences:  Family denies family issues due to drug use. Blackouts:  Patient reports that he has blacked out before in the past DT's: None Withdrawal Symptoms:   Patient talked about how marijuana suppresses his nightmares. IT would appear that patient suggest that when he does not smoke marijuana, he may experience nightmares.  Past Medical History:  Past Medical History:  Diagnosis Date   ADD (attention deficit disorder)     Past Surgical History:  Procedure Laterality Date   NERVE, TENDON AND ARTERY REPAIR Left 12/15/2018   Procedure: EXPLORATION REPAIR TENDON LEFT INDEX AND LACERATION LEFT MIDDLE FINGER;  Surgeon: Leanora Cover, MD;  Location: [place];  Service: Orthopedics;  Laterality: Left;   TONSILLECTOMY  2000    Family Psychiatric History:  Sister - Schizoaffective disorder Grandmother (paternal) - Schizophrenia + depression  Family History:  Family History  Problem Relation Age of Onset   Arthritis Mother    Hyperlipidemia Mother    Hypertension Mother    Alcohol  abuse Maternal Grandmother    Diabetes Maternal Grandmother    Heart disease Maternal Grandmother    Diabetes Maternal Grandfather    Heart disease Maternal Grandfather    Alcohol abuse Paternal Grandfather     Social History:   Social History   Socioeconomic History   Marital status: Single    Spouse name: Not on file   Number of children: Not on file   Years of education: Not on file   Highest education level: Not on file  Occupational History   Not on file  Tobacco Use   Smoking status: Every Day    Packs/day: 0.33    Years:  8.00    Pack years: 2.64    Types: Cigarettes   Smokeless tobacco: Never  Vaping Use   Vaping Use: Never used  Substance and Sexual Activity   Alcohol use: Yes    Alcohol/week: 0.0 standard drinks    Comment: rare--beer   Drug use: Yes    Types: Marijuana   Sexual activity: Yes    Birth control/protection: Condom  Other Topics Concern   Not on file  Social History Narrative   Not on file   Social Determinants of Health   Financial Resource Strain: Not on file  Food Insecurity: Not on file  Transportation Needs: Not on file  Physical Activity: Not on file  Stress: Not on file  Social Connections: Not on file    Additional Social History:  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  Allergies:  No Known Allergies  Metabolic Disorder Labs: No results found for: HGBA1C, MPG No results found for: PROLACTIN No results found for: CHOL, TRIG, HDL, CHOLHDL, VLDL, LDLCALC No results found for: TSH  Therapeutic Level Labs: No results found for: LITHIUM No results found for: CBMZ No results found for: VALPROATE  Current Medications: Current Outpatient Medications  Medication Sig Dispense Refill   paliperidone (INVEGA) 6 MG 24 hr tablet Take 1 tablet (6 mg total) by mouth at bedtime. 30 tablet 1   traZODone (DESYREL) 50 MG tablet Take 1 tablet (50 mg total) by mouth at bedtime. 30 tablet 1   No current facility-administered medications for this visit.    Musculoskeletal: Strength & Muscle Tone: within normal limits Gait & Station: normal Patient leans: N/A  Psychiatric Specialty Exam: Review of Systems  Psychiatric/Behavioral:  Positive for behavioral problems, decreased concentration and sleep disturbance. Negative for confusion, dysphoric mood, hallucinations, self-injury and suicidal ideas. The patient is nervous/anxious. The patient is not hyperactive.    Pulse 95, height 6\' 2"  (1.88 m), weight 182  lb (82.6 kg).Body mass index is 23.37 kg/m.  General Appearance: Fairly Groomed  Eye Contact:  Fair  Speech:  Clear and Coherent and Pressured  Volume:  Normal  Mood:  Anxious and Irritable  Affect:  Congruent, Full Range, and Restricted  Thought Process:  Disorganized, Irrelevant, and Descriptions of Associations: Loose  Orientation:  Full (Time, Place, and Person)  Thought Content:  Illogical, Delusions, Paranoid Ideation, and Tangential  Suicidal Thoughts:  No  Homicidal Thoughts:  No  Memory:  Immediate;   Fair Recent;   Fair Remote;   Fair  Judgement:  Impaired  Insight:  Lacking  Psychomotor Activity:  Increased and Restlessness  Concentration:  Concentration: Fair and Attention Span: Fair  Recall:  [employer] of Knowledge:Fair  Language: Good  Akathisia:  NA  Handed:  Right  AIMS (if indicated):  not done  Assets:  Communication Skills Housing Social Support  ADL's:  Intact  Cognition: Impaired,  Mild  Sleep:  Poor   Screenings: GAD-7    Physiological scientist Office Visit from 01/29/2021 in [facility]  Total GAD-7 Score 1      PHQ2-9    [place] Office Visit from 01/29/2021 in [facility] Office Visit from 06/16/2018 in [place] at [facility] Visit from 10/15/2015 in [place] at [facility] Total Score 0 1 0  PHQ-9 Total Score -- 1 --      [place] Visit from 01/29/2021 in [facility] ED from 01/16/2021 in [place] DEPT  C-SSRS RISK CATEGORY No Risk No Risk       Assessment and Plan:   Damieon A. Grissett is a 25 year old male with a past psychiatric history significant for schizoaffective disorder and insomnia who presents to [facility] for medication management.  [name] was brought in during the encounter for collateral.  Patient was recently  discharged from [facility] after being admitted for acute psychosis/paranoia/delusions.  Patient was discharged with a prescription for the following medications: trazodone 50 mg at bedtime and Invega 6 mg at bedtime.  Patient has not been on any medications due to the cost of the Invega medication.  Patient exhibits paranoia and hyper religious fixation during the encounter.  Provider informed patient's mom that medications could be sent to a different pharmacy with more reasonable cost.  [name] was agreeable to recommendation.  Patient appears apprehensive in taking the medication.  [name] reports that the patient promised that he would try the medications out if he were able to obtain them after being discharged from [facility]. Patient's medication to be e-prescribed to preferred pharmacy of choice.  1. Insomnia due to other mental disorder  - traZODone (DESYREL) 50 MG tablet; Take 1 tablet (50 mg total) by mouth at bedtime.  Dispense: 30 tablet; Refill: 1  2. Schizoaffective disorder, unspecified type ([place])  - paliperidone (INVEGA) 6 MG 24 hr tablet; Take 1 tablet (6 mg total) by mouth at bedtime.  Dispense: 30 tablet; Refill: 1  Patient to follow up in 7 weeks Provider spent a total of 45 minutes with the patient/reviewing patient's chart  Malachy Mood, PA 11/2/20228:55 PM

## 2021-03-25 ENCOUNTER — Encounter (HOSPITAL_COMMUNITY): Payer: No Payment, Other | Admitting: Physician Assistant

## 2021-07-22 ENCOUNTER — Emergency Department (HOSPITAL_COMMUNITY)
Admission: EM | Admit: 2021-07-22 | Discharge: 2021-07-23 | Disposition: A | Discharge status: Home / Self Care | Payer: Self-pay | Attending: Emergency Medicine | Admitting: Emergency Medicine

## 2021-07-22 ENCOUNTER — Other Ambulatory Visit: Payer: Self-pay

## 2021-07-22 ENCOUNTER — Encounter (HOSPITAL_COMMUNITY): Payer: Self-pay

## 2021-07-22 DIAGNOSIS — Z20822 Contact with and (suspected) exposure to covid-19: Secondary | ICD-10-CM | POA: Insufficient documentation

## 2021-07-22 DIAGNOSIS — F22 Delusional disorders: Secondary | ICD-10-CM | POA: Insufficient documentation

## 2021-07-22 DIAGNOSIS — F29 Unspecified psychosis not due to a substance or known physiological condition: Secondary | ICD-10-CM | POA: Insufficient documentation

## 2021-07-22 HISTORY — DX: Unspecified psychosis not due to a substance or known physiological condition: F29

## 2021-07-22 HISTORY — DX: Antisocial personality disorder: F60.2

## 2021-07-22 LAB — COMPREHENSIVE METABOLIC PANEL
ALT: 18 U/L (ref 0–44)
AST: 25 U/L (ref 15–41)
Albumin: 4.3 g/dL (ref 3.5–5.0)
Alkaline Phosphatase: 35 U/L — ABNORMAL LOW (ref 38–126)
Anion gap: 8 (ref 5–15)
BUN: 10 mg/dL (ref 6–20)
CO2: 25 mmol/L (ref 22–32)
Calcium: 9.3 mg/dL (ref 8.9–10.3)
Chloride: 106 mmol/L (ref 98–111)
Creatinine, Ser: 1.11 mg/dL (ref 0.61–1.24)
GFR, Estimated: >60 mL/min (ref >60)
Glucose, Bld: 97 mg/dL (ref 70–99)
Potassium: 3.6 mmol/L (ref 3.5–5.1)
Sodium: 139 mmol/L (ref 135–145)
Total Bilirubin: 1.1 mg/dL (ref 0.3–1.2)
Total Protein: 7.8 g/dL (ref 6.5–8.1)

## 2021-07-22 LAB — CBC
HCT: 39.6 % (ref 39.0–52.0)
Hemoglobin: 14 g/dL (ref 13.0–17.0)
MCH: 32.5 pg (ref 26.0–34.0)
MCHC: 35.4 g/dL (ref 30.0–36.0)
MCV: 91.9 fL (ref 80.0–100.0)
Platelets: 235 10*3/uL (ref 150–400)
RBC: 4.31 MIL/uL (ref 4.22–5.81)
RDW: 12.8 % (ref 11.5–15.5)
WBC: 5.9 10*3/uL (ref 4.0–10.5)
nRBC: 0 % (ref 0.0–0.2)

## 2021-07-22 LAB — RESP PANEL BY RT-PCR (FLU A&B, COVID) ARPGX2
Influenza A by PCR: NEGATIVE
Influenza B by PCR: NEGATIVE
SARS Coronavirus 2 by RT PCR: NEGATIVE

## 2021-07-22 LAB — ETHANOL: Alcohol, Ethyl (B): <10 mg/dL (ref <10)

## 2021-07-22 NOTE — ED Notes (Signed)
Patient aware that a urine specimen is needed and to let us know when he needs to urinate. ?

## 2021-07-22 NOTE — BH Assessment (Signed)
Comprehensive Clinical Assessment (CCA) Note ? ?07/23/2021 ?Elijah PoliJonathan A Castillo ?[id number] ? ?Discharge Disposition: ?Nira ConnJason Berry, NP, reviewed pt's chart and information and determined pt meets inpatient criteria. Pt's referral information will be faxed out to multiple hospitals, including [facility], for potential placement. This information was relayed to pt's team at 0015. ? ?The patient demonstrates the following risk factors for suicide: Chronic risk factors for suicide include: psychiatric disorder of Psychosis, unspecified . Acute risk factors for suicide include: family or marital conflict, unemployment, and social withdrawal/isolation. Protective factors for this patient include: positive social support and hope for the future. Considering these factors, the overall suicide risk at this point appears to be none. Patient is not appropriate for outpatient follow up. ? ?Therefore, no sitter is recommended for suicide precautions. ? ?Flowsheet Row ED from [facility]/20[facility] from [facility]/20[facility] in [facility]  ?C-SSRS RISK CATEGORY No Risk No Risk No Risk  ? ?  ?Chief Complaint:  ?Chief Complaint  ?Patient presents with  ? Mental Health Problem  ? Delusional  ? Hallucinations  ? ?Visit Diagnosis: Psychosis, unspecified ? ?CCA Screening, Triage and Referral (STR) ?Elijah Castillo is a 26 year old patient who [facility] under IVC paperwork. The paperwork states: ? ?"Respondent is a 26 year old male last committed in 2022 and was committed for 5 days. Respondent is prescribed antipsychotic medication but refuses to take them. Respondent uses marijuana daily and may use "mollies" as well. Respondent has been hallucinating as of recently, stating that people are out to get him and says if someone tries to get him they will die. Respondent told his mother  that he could see a black shadow around her and that she is conspiring with the people who are trying to get him. On today, he has been vandalizing his room and the garage. Petitioner advises that she is afraid that he will become more violent due to him having confrontations with neighbors a few weeks ago. Respondent did have a gun that was taken by law enforcement but states that he has another one." ? ?Elijah Castillo, mother: [phone] ? ?Pt states, "It was somebody else's choice to have me involuntarily committed. They felt (I needed to be committed) - it's not my choice. There's nothing serious going on - the person did it because he was mad. There's nothing wrong - my mood is stabilized. I haven't done any harm to anyone."  ? ?Pt denies SI or a hx of SI. He denies any prior attempts to kill himself or a plan to kill himself. Pt shares he was in the ED in October 2022 for MH concerns and that he was d/[facility] ED [facility] for one [facility]. Pt denies HI, AVH, and NSSIB. He states he owns guns that are licensed and registered and that he has them hidden. Pt shares he was ticketed for firing a gun in his yard for target practice; pt states he was unaware it was illegal to do this. Pt shares he vapes marijuana every-other day and that he last vaped this morning. ? ?Pt is oriented x5. His recent/remote memory is intact. Pt was cooperative throughout the assessment process. Pt's insight, judgement, and impulse control is impaired at this time. ? ?Patient Reported Information ?How did you hear about us? No data recorded ?What Is the Reason for Your Visit/Call Today? Pt states, "It was  somebody else's choice to have me involuntarily committed. They felt (I needed to be committed) - it's not my choice. There's nothing serious going on - the person did it because he was mad." Pt denies SI or a hx of SI. He denies any prior attempts to kill himself or a plan to kill himself. Pt shares he was in the ED  in October 2022 for MH concerns and that he was d/c from the ED to [facility] for one week. Pt denies HI, AVH, and NSSIB. He states he owns guns that are licensed and registered and that he has them hidden. Pt shares he was ticketed for firing a gun in his yard for target practice; pt states he was unaware it was illegal to do this. Pt shares he vapes marijuana every-other day and that he last vaped this morning. ? ?How Long Has This Been Causing You Problems? 1 wk - 1 month ? ?What Do You Feel Would Help You the Most Today? -- (Pt would like to be d/c) ? ? ?Have You Recently Had Any Thoughts About Hurting Yourself? No ? ?Are You Planning to Commit Suicide/Harm Yourself At This time? No ? ? ?Have you Recently Had Thoughts About Hurting Someone Elijah Castillo? No ? ?Are You Planning to Harm Someone at This Time? No ? ?Explanation: No data recorded ? ?Have You Used Any Alcohol or Drugs in the Past 24 Hours? Yes ? ?How Long Ago Did You Use Drugs or Alcohol? No data recorded ?What Did You Use and How Much? Pt shares he vapes marijuana every-other day and that he last vaped this morning. ? ? ?Do You Currently Have a Therapist/Psychiatrist? No ? ?Name of Therapist/Psychiatrist: No data recorded ? ?Have You Been Recently Discharged From Any Office Practice or Programs? No ? ?Explanation of Discharge From Practice/Program: No data recorded ? ?  ?CCA Screening Triage Referral Assessment ?Type of Contact: Tele-Assessment ? ?Telemedicine Service Delivery: Telemedicine service delivery: This service was provided via telemedicine using a 2-way, interactive audio and video technology ? ?Is this Initial or Reassessment? Initial Assessment ? ?Date Telepsych consult ordered in [facility]:  07/22/21 ? ?Time Telepsych consult ordered in [facility]:  2321 ? ?Location of Assessment: [facility] ED ? ?Provider Location: [facility] Services ? ? ?Collateral Involvement: IVC paperwork ? ? ?Does Patient Have a Automotive engineer Guardian? No data  recorded ?Name and Contact of Legal Guardian: No data recorded ?If Minor and Not Living with Parent(s), Who has Custody? N/A ? ?Is CPS involved or ever been involved? Never ? ?Is APS involved or ever been involved? Never ? ? ?Patient Determined To Be At Risk for Harm To Self or Others Based on Review of Patient Reported Information or Presenting Complaint? No ? ?Method: No data recorded ?Availability of Means: No data recorded ?Intent: No data recorded ?Notification Required: No data recorded ?Additional Information for Danger to Others Potential: No data recorded ?Additional Comments for Danger to Others Potential: No data recorded ?Are There Guns or Other Weapons in Your Home? No data recorded ?Types of Guns/Weapons: No data recorded ?Are These Weapons Safely Secured?                            No data recorded ?Who Could Verify You Are Able To Have These Secured: No data recorded ?Do You Have any Outstanding Charges, Pending Court Dates, Parole/Probation? No data recorded ?Contacted To Inform of Risk of Harm To Self  or Others: -- (N/A) ? ? ? ?Does Patient Present under Involuntary Commitment? Yes ? ?IVC Papers Initial File Date: 07/22/21 ? ? ?[place] of Residence: [place] ? ? ?Patient Currently Receiving the Following Services: Not Receiving Services ? ? ?Determination of Need: Emergent (2 hours) ? ? ?Options For Referral: Medication Management; Outpatient Therapy; Inpatient Hospitalization ? ? ? ? ?CCA Biopsychosocial ?Patient Reported Schizophrenia/Schizoaffective Diagnosis in Past: Yes ? ? ?Strengths: Pt has consistent housing. He is able to identify his thoughts, feelings, and concerns. ? ? ?Mental Health Symptoms ?Depression:   ?None ?  ?Duration of Depressive symptoms:  ?Duration of Depressive Symptoms: N/A ?  ?Mania:   ?Overconfidence; Increased Energy; Change in energy/activity ?  ?Anxiety:    ?Tension; Worrying; Irritability ?  ?Psychosis:   ?Delusions; Hallucinations ?  ?Duration of Psychotic symptoms:   ?Duration of Psychotic Symptoms: Greater than six months ?  ?Trauma:   ?None ?  ?Obsessions:   ?Intrusive/time consuming (my sister practices witchcraft and wants to harm my family) ?  ?Compulsions:

## 2021-07-22 NOTE — ED Triage Notes (Incomplete)
Pt brought in by [employer] for IVC. [employer] reports that  ? ?Pt reports that he has spiritual gifts and can see when evil is touching people. Pt states "when you fart, it's the devil" ?

## 2021-07-22 NOTE — ED Notes (Signed)
IVC paperwork states the following: ? ?"... Last committed in 2022 and was committed for 5 days. Respondent is prescribed antipsychotic medication but refuses to take them. Respondent uses marijuana daily and may use "mollies" as well. Respondent has been hallucinating as of recently, stating that people are out to get him and says if someone tries to get him they will die. Respondent told his mother that he could see a black shadow around her and that she is conspiring with the people who are trying to get him. On today, he has been vandalizing his room and the garage. Petitioner advises that she is afraid that he will become more violent due to him having confrontations with neighbors a few weeks ago. Respondent did have a gun that was taken by law enforcement but states that he has another one." ?

## 2021-07-22 NOTE — ED Notes (Addendum)
Went to check on patient and door was only cracked open. Pt educated again regarding needing to leave room door open for observation. Pt expressed frustration and compromise was made to leave door 1/3 of the way open. Sitter coverage pending. ?

## 2021-07-22 NOTE — ED Notes (Signed)
Specimen cup provided for urine and pt encouraged to go to the bathroom to expedite process. Pt states he still does not need to urinate. Pt provided water by ED tech. ?

## 2021-07-22 NOTE — ED Notes (Signed)
PD left bedside and pt started to get more agitated - asking if it's illegal and what would happen if he tried to leave, getting a little more aggressive in his speech, etc. Pt states that he does not want to take any medication to help him sleep while waiting for TTS. Myself and [employer] officer came to bedside and were able to assist in calming patient down. Pt calm and agreeable to staying, and aware that sometimes it takes several hours prior to TTS assessment. Provider notified. ?

## 2021-07-22 NOTE — ED Provider Notes (Signed)
?[facility] DEPT ?Provider Note ? ? ?CSN: [id number] ?Arrival date & time: 07/22/21  1924 ? ?  ? ?History ? ?Chief Complaint  ?Patient presents with  ? Mental Health Problem  ? ? ?Elijah Castillo is a 26 y.o. male. ? ? ?Mental Health Problem ?Patient brought in under IVC.  26 year old.  Patient states he is able to see when demons are around.  States that he is able to control them and get them out.  Reportedly has not been taking his medicines.  History of psychosis and does not take his medicine.  Has been previously IVC had.  Reportedly has been more violent.  Patient denies drug use.  Patient states he comes from [place]. ?Patient cannot provide a reliable history at this time ? ? ? ?  ?Past Medical History:  ?Diagnosis Date  ? ADD (attention deficit disorder)   ? ? ?Home Medications ?Prior to Admission medications   ?Medication Sig Start Date End Date Taking? Authorizing Provider  ?paliperidone (INVEGA) 6 MG 24 hr tablet Take 1 tablet (6 mg total) by mouth at bedtime. 01/29/21   Malachy Mood, PA  ?traZODone (DESYREL) 50 MG tablet Take 1 tablet (50 mg total) by mouth at bedtime. 01/29/21   Malachy Mood, PA  ?   ? ?Allergies    ?Patient has no known allergies.   ? ?Review of Systems   ?Review of Systems ? ?Physical Exam ?Updated Vital Signs ?BP (!) 158/112   Pulse 82   Temp 98.9 ?F (37.2 ?C) (Oral)   Resp 18   Ht 6\' 5"  (1.956 m)   Wt 95.3 kg   SpO2 100%   BMI 24.90 kg/m?  ?Physical Exam ?Vitals and nursing note reviewed.  ?Eyes:  ?   Pupils: Pupils are equal, round, and reactive to light.  ?Cardiovascular:  ?   Rate and Rhythm: Normal rate.  ?Musculoskeletal:     ?   General: No tenderness.  ?   Cervical back: Neck supple.  ?Skin: ?   General: Skin is warm.  ?Neurological:  ?   Mental Status: He is alert.  ?Psychiatric:  ?   Comments: Strange affect.  Keeps looking towards the door and other sounds.  ? ? ?ED Results / Procedures / Treatments   ?Labs ?(all labs ordered  are listed, but only abnormal results are displayed) ?Labs Reviewed  ?COMPREHENSIVE METABOLIC PANEL - Abnormal; Notable for the following components:  ?    Result Value  ? Alkaline Phosphatase 35 (*)   ? All other components within normal limits  ?RESP PANEL BY RT-PCR (FLU A&B, COVID) [id number]  ?ETHANOL  ?CBC  ?RAPID URINE DRUG SCREEN, HOSP PERFORMED  ? ? ?EKG ?None ? ?Radiology ?No results found. ? ?Procedures ?Procedures  ? ? ?Medications Ordered in ED ?Medications - No data to display ? ?ED Course/ Medical Decision Making/ A&P ?  ?                        ?Medical Decision Making ?Amount and/or Complexity of Data Reviewed ?Labs: ordered. ? ? ?Patient presents with mental status change.  Brought in under IVC.  Reportedly has not been taking medicine.  Hallucinating.  Patient is medically cleared at this time.  However has had previous IVC and inpatient treatment.  Likely will need the same this time.  We will have patient seen by TTS.  First examination will have been done ? ? ? ? ? ? ? ?  Final Clinical Impression(s) / ED Diagnoses ?Final diagnoses:  ?Psychosis, unspecified psychosis type ([place])  ? ? ?Rx / DC Orders ?ED Discharge Orders   ? ? None  ? ?  ? ? ?  ?Davonna Belling, MD ?07/22/21 2320 ? ?

## 2021-07-22 NOTE — ED Triage Notes (Addendum)
Pt brought in by [employer] for IVC by parents. [employer] reports pt is cooperative with them on scene and en route to hospital. Pt lives at home with parents currently. Pt has been IVC in the past. ? ?Pt reports that he has spiritual gifts and can see evil through people. Pt states "when you fart, it's the devil". Pt has pressured speech and grandeur thoughts regarding religion and spirituality. Pt states that he offended his friends by asking if they like to hang out with white people and they asked "'why are you acting like this?' And then all hell broke loose". "All I did was ask a question and I got called crazy for it. I didn't threaten anybody". Denies SI or HI. ? ?Pt A&Ox4, noted to be hypertensive - denies symptoms. ?

## 2021-07-23 ENCOUNTER — Inpatient Hospital Stay (HOSPITAL_COMMUNITY)
Admission: AD | Admit: 2021-07-23 | Payer: Federal, State, Local not specified - Other | Source: Intra-hospital | Admitting: Emergency Medicine

## 2021-07-23 ENCOUNTER — Encounter (HOSPITAL_COMMUNITY): Payer: Self-pay

## 2021-07-23 DIAGNOSIS — F29 Unspecified psychosis not due to a substance or known physiological condition: Secondary | ICD-10-CM

## 2021-07-23 LAB — RAPID URINE DRUG SCREEN, HOSP PERFORMED
Amphetamines: NOT DETECTED
Barbiturates: NOT DETECTED
Benzodiazepines: NOT DETECTED
Cocaine: NOT DETECTED
Opiates: NOT DETECTED
Tetrahydrocannabinol: POSITIVE — AB

## 2021-07-23 MED ORDER — ZIPRASIDONE MESYLATE 20 MG IM SOLR
20.0000 mg | Freq: Once | INTRAMUSCULAR | Status: AC | PRN
  Administered 2021-07-23: 20 mg via INTRAMUSCULAR
  Filled 2021-07-23: qty 20

## 2021-07-23 MED ORDER — STERILE WATER FOR INJECTION IJ SOLN
INTRAMUSCULAR | Status: AC
  Filled 2021-07-23: qty 10

## 2021-07-23 NOTE — ED Notes (Signed)
Pt resting comfortably at this time.

## 2021-07-23 NOTE — BH Assessment (Addendum)
[facility] Assessment Progress Note ?  ?Per Alan Mulder, NP, this pt does not require psychiatric hospitalization at this time.  Pt presents under IVC; EDP Pricilla Loveless will be asked to rescind it.  Pt is psychiatrically cleared.  Discharge instructions include referral information for [facility].  Dr Criss Alvine and pt's nurse, Casimiro Needle, have been notified. ? ?Doylene Canning, MA ?Triage Specialist ?[phone] ? ?

## 2021-07-23 NOTE — ED Notes (Signed)
One bag of belongings transferred back to [facility]. ?

## 2021-07-23 NOTE — ED Provider Notes (Signed)
Psychiatry, Alan Mulder, has cleared patient for discharge.  He is not actively psychotic when I am talking to him now and has no SI/HI.  He is calm.  Will reverse IVC per the recommendations. ?  ?Pricilla Loveless, MD ?07/23/21 1449 ? ?

## 2021-07-23 NOTE — Progress Notes (Deleted)
BHH/BMU LCSW Progress Note ?  ?07/23/2021    11:40 AM ? ?[facility]  ? ?[id number]  ? ?Type of Contact and Topic:  Psychiatric Bed Placement  ? ?Pt accepted to [facility]02    ? ?Patient meets inpatient criteria per Nira Conn, NP  ? ?The attending provider will be Mason Jim, MD  ? ?Call report to [phone]   ? ?Fraser Din, RN @ [facility] ED notified.    ? ?Pt scheduled  to arrive at [facility] TODAY @ 1500.  ? ? ?Damita Dunnings, MSW, LCSW-A  ?11:42 AM 07/23/2021   ?  ? ?  ?  ? ? ? ? ?  ?

## 2021-07-23 NOTE — ED Provider Notes (Signed)
Emergency Medicine Observation Re-evaluation Note ? ?Elijah Castillo is a 26 y.o. male, seen on rounds today.  Pt initially presented to the ED for complaints of Mental Health Problem, Delusional, and Hallucinations ?Currently, the patient is awaiting placement. He's agitated because he hasn't been able to leave. ? ?Physical Exam  ?BP (!) 141/89   Pulse 66   Temp 98.9 ?F (37.2 ?C) (Oral)   Resp 18   Ht 6\' 5"  (1.956 m)   Wt 95.3 kg   SpO2 100%   BMI 24.90 kg/m?  ?Physical Exam ?General: sitting on stretcher, agitated ?Cardiac: not assessed ?Lungs: normal effort ? ?ED Course / MDM  ?EKG:EKG Interpretation ? ?Date/Time:  Tuesday July 22 2021 20:52:01 EDT ?Ventricular Rate:  85 ?PR Interval:  153 ?QRS Duration: 93 ?QT Interval:  356 ?QTC Calculation: 424 ?R Axis:   79 ?Text Interpretation: Sinus rhythm Consider left atrial enlargement Anterior infarct, old Abnormal T, consider ischemia, diffuse leads T wave changes similar to oct 2022 Confirmed by Nov 2022 [phone]) on 07/23/2021 8:58:28 AM ? ?I have reviewed the labs performed to date as well as medications administered while in observation.  No recent changes in the last 24 hours. ? ?Plan  ?Current plan is for inpatient psychiatric placement. Will give IM geodon given his agitation and psychosis. ?JCION BUDDENHAGEN is under involuntary commitment. ?  ? ?  ?[name], MD ?07/23/21 [phone] ? ?

## 2021-07-23 NOTE — Consult Note (Addendum)
Telepsych Consultation  ? ?Reason for Consult:  Psych Consult ?Referring Physician:  Dr. Criss Alvine ?Location of Patient: [facility] ?Location of Provider: Behavioral Health TTS Department ? ?Patient Identification: Elijah Castillo ?MRN:  [id number] ?Principal Diagnosis: <principal problem not specified> ?Diagnosis:  Active Problems: ?  * No active hospital problems. * ? ? ?Total Time spent with patient: 1 hour ? ?Subjective:   ?Elijah Castillo is a 26 y.o. male patient admitted with to [facility] for alleged hallucination. Patient stated he got into an argument with mother and she IVC him . ? ?HPI:  As per [facility] chart review. Patient brought in under IVC.  26 year old.  Patient states he is able to see when demons are around.  States that he is able to control them and get them out.  Reportedly has not been taking his medicines.  History of psychosis and does not take his medicine.  Has been previously IVC had. Reportedly has been more violent.  Patient denies drug use.  Patient states he comes from [place]. Patient cannot provide a reliable history at this time ?  ?Patient reported to this provider that he got into argument with his mother and she IVC him, where the police came and picked him to the hospital. Added that this is the second time she took out papers on him. Reported he is angry because it creates financial difficulty for him to pay these hospital bills. When asked about hallucination, stated "I have a special spiritual gist to see through things but not to hurt somebody." Denied SI/HI/AVH. Denied drug use, alcohol use and tobacco products use or dependent. Endorsed using marijuana occasionally. Instructions provided on cessation of substance use and the adverse effect son the body system. Denied family hx of mental illness. Endorsed 7 hours sleep, good appetite, being safe at home, fire arms being secured at home ? ?On assess of patient via Telepsych, he is sitting calmly on the bed. Chart reviewed and findings  shared with the tx team and discussed with Dr. Lucianne Muss. Alert and oriented x 4. Maintained fair eye contact during encounter. Speech is clear and coherent. Thought process and thought content are coherent, logical and linear. ? ?Disposition: Based on my assessment of the patient, he has no evidence of imminent risk to self or others at present and does not meet criteria for psychiatric inpatient admission. He is psych cleared and can be discharged to home when medically stable. Supportive therapy provided about ongoing stressors. Discussed crisis plan, support from social network, calling 911, coming to the Emergency Department, and calling Suicide Hotline. [facility] nursing staff and [facility] made aware of patient's disposition. ? ? ?Past Psychiatric History: Psychosis, ADD, Antisocial Disorder in Adolescence ? ?Risk to Self:  No ?Risk to Others:  No ?Prior Inpatient Therapy:  No ?Prior Outpatient Therapy: Yes  ? ?Past Medical History:  ?Past Medical History:  ?Diagnosis Date  ? ADD (attention deficit disorder)   ? Antisocial personality disorder in adolescent [facility])   ? Psychosis (HCC)   ?  ?Past Surgical History:  ?Procedure Laterality Date  ? NERVE, TENDON AND ARTERY REPAIR Left 12/15/2018  ? Procedure: EXPLORATION REPAIR TENDON LEFT INDEX AND LACERATION LEFT MIDDLE FINGER;  Surgeon: Betha Loa, MD;  Location: [facility];  Service: Orthopedics;  Laterality: Left;  ? TONSILLECTOMY  2000  ? ?Family History:  ?Family History  ?Problem Relation Age of Onset  ? Arthritis Mother   ? Hyperlipidemia Mother   ? Hypertension Mother   ? Alcohol  abuse Maternal Grandmother   ? Diabetes Maternal Grandmother   ? Heart disease Maternal Grandmother   ? Diabetes Maternal Grandfather   ? Heart disease Maternal Grandfather   ? Alcohol abuse Paternal Grandfather   ? ?Family Psychiatric  History: None reported ?Social History:  ?Social History  ? ?Substance and Sexual Activity  ?Alcohol Use Yes  ? Alcohol/week: 0.0 standard  drinks  ? Comment: rare--beer  ?   ?Social History  ? ?Substance and Sexual Activity  ?Drug Use Yes  ? Types: Marijuana  ?  ?Social History  ? ?Socioeconomic History  ? Marital status: Single  ?  Spouse name: Not on file  ? Number of children: Not on file  ? Years of education: Not on file  ? Highest education level: Not on file  ?Occupational History  ? Not on file  ?Tobacco Use  ? Smoking status: Every Day  ?  Packs/day: 0.33  ?  Years: 8.00  ?  Pack years: 2.64  ?  Types: Cigarettes  ? Smokeless tobacco: Never  ?Vaping Use  ? Vaping Use: Never used  ?Substance and Sexual Activity  ? Alcohol use: Yes  ?  Alcohol/week: 0.0 standard drinks  ?  Comment: rare--beer  ? Drug use: Yes  ?  Types: Marijuana  ? Sexual activity: Yes  ?  Birth control/protection: Condom  ?Other Topics Concern  ? Not on file  ?Social History Narrative  ? Not on file  ? ?Social Determinants of Health  ? ?Financial Resource Strain: Not on file  ?Food Insecurity: Not on file  ?Transportation Needs: Not on file  ?Physical Activity: Not on file  ?Stress: Not on file  ?Social Connections: Not on file  ? ?Additional Social History: ?  ? ?Allergies:  No Known Allergies ? ?Labs:  ?Results for orders placed or performed during the hospital encounter of 07/22/21 (from the past 48 hour(s))  ?Urine rapid drug screen (hosp performed)     Status: Abnormal  ? Collection Time: 07/22/21  8:21 AM  ?Result Value Ref Range  ? Opiates NONE DETECTED NONE DETECTED  ? Cocaine NONE DETECTED NONE DETECTED  ? Benzodiazepines NONE DETECTED NONE DETECTED  ? Amphetamines NONE DETECTED NONE DETECTED  ? Tetrahydrocannabinol POSITIVE (A) NONE DETECTED  ? Barbiturates NONE DETECTED NONE DETECTED  ?  Comment: (NOTE) ?DRUG SCREEN FOR MEDICAL PURPOSES ?ONLY.  IF CONFIRMATION IS NEEDED ?FOR ANY PURPOSE, NOTIFY LAB ?WITHIN 5 DAYS. ? ?LOWEST DETECTABLE LIMITS ?FOR URINE DRUG SCREEN ?Drug Class                     Cutoff (ng/mL) ?Amphetamine and metabolites    1000 ?Barbiturate and  metabolites    200 ?Benzodiazepine                 200 ?Tricyclics and metabolites     300 ?Opiates and metabolites        300 ?Cocaine and metabolites        300 ?THC                            50 ?Performed at [facility], [address] ?[place], [place] [address] ?  ?Resp Panel by RT-PCR (Flu A&B, Covid) Nasopharyngeal Swab     Status: None  ? Collection Time: 07/22/21  8:21 PM  ? Specimen: Nasopharyngeal Swab; Nasopharyngeal(NP) swabs in vial transport medium  ?Result Value Ref Range  ? SARS Coronavirus  2 by RT PCR NEGATIVE NEGATIVE  ?  Comment: (NOTE) ?SARS-CoV-2 target nucleic acids are NOT DETECTED. ? ?The SARS-CoV-2 RNA is generally detectable in upper respiratory ?specimens during the acute phase of infection. The lowest ?concentration of SARS-CoV-2 viral copies this assay can detect is ?138 copies/mL. A negative result does not preclude SARS-Cov-2 ?infection and should not be used as the sole basis for treatment or ?other patient management decisions. A negative result may occur with  ?improper specimen collection/handling, submission of specimen other ?than nasopharyngeal swab, presence of viral mutation(s) within the ?areas targeted by this assay, and inadequate number of viral ?copies(<138 copies/mL). A negative result must be combined with ?clinical observations, patient history, and epidemiological ?information. The expected result is Negative. ? ?Fact Sheet for Patients:  ?BloggerCourse.comhttps://www.fda.gov/media/152166/download ? ?Fact Sheet for Healthcare Providers:  ?SeriousBroker.ithttps://www.fda.gov/media/152162/download ? ?This test is no t yet approved or cleared by the [place] FDA and  ?has been authorized for detection and/or diagnosis of SARS-CoV-2 by ?FDA under an Emergency Use Authorization (EUA). This EUA will remain  ?in effect (meaning this test can be used) for the duration of the ?COVID-19 declaration under Section 564(b)(1) of the Act, 21 ?U.S.C.section 360bbb-3(b)(1), unless the  authorization is terminated  ?or revoked sooner.  ? ? ?  ? Influenza A by PCR NEGATIVE NEGATIVE  ? Influenza B by PCR NEGATIVE NEGATIVE  ?  Comment: (NOTE) ?The Xpert Xpress SARS-CoV-2/FLU/RSV plus assay is inte

## 2021-07-23 NOTE — Progress Notes (Signed)
Per Alan Mulder, NP, Pt has been psych cleared with services.  ? ? ?Damita Dunnings, MSW, LCSW-A  ?2:54 PM 07/23/2021   ?

## 2021-07-23 NOTE — ED Notes (Signed)
Patient came up to nurses' station and was asking when he could go home saying that someone last night told him that they needed to get his papers together and that he could go in the morning. Patient stating that he has work to get to that he can't miss and that he can't afford to just be sitting here, saying that it costs him too much money. Patient stated that he was just going walk out of here and go to work. It was explained to the patient that he is currently under IVC and that psych team has recommended inpatient per their note. Patient becoming increasingly annoyed stating that he doesn't need to be inpatient and that it is just a waste of money for him. MD aware. ?

## 2021-07-23 NOTE — Discharge Instructions (Addendum)
For your behavioral health needs you are advised to follow up with [facility] at your earliest opportunity: ?     [facility] ?     [address] ?     [place], [place] [address] ?     [phone] ?     They offer psychiatry/medication management, therapy and substance use disorder treatment.  New patients are seen in their walk-in clinic.  Walk-in hours are Monday, Wednesday, Thursday and Friday from 8:00 am - 11:00 am for psychiatry, and Monday and Wednesday from 8:00 am - 11:00 am for therapy.  Walk-in patients are seen on a first come, first served basis, so try to arrive as early as possible for the best chance of being seen the same day.  Please note that to be eligible for services you must bring an ID or a piece of mail with your name and a [place] address. ? ?

## 2021-08-07 ENCOUNTER — Ambulatory Visit (HOSPITAL_COMMUNITY)
Admission: EM | Admit: 2021-08-07 | Discharge: 2021-08-07 | Disposition: A | Discharge status: Home / Self Care | Payer: No Payment, Other | Attending: Behavioral Health | Admitting: Behavioral Health

## 2021-08-07 ENCOUNTER — Telehealth (HOSPITAL_COMMUNITY): Payer: Self-pay | Admitting: Physician Assistant

## 2021-08-07 DIAGNOSIS — R443 Hallucinations, unspecified: Secondary | ICD-10-CM | POA: Insufficient documentation

## 2021-08-07 DIAGNOSIS — Z8659 Personal history of other mental and behavioral disorders: Secondary | ICD-10-CM | POA: Insufficient documentation

## 2021-08-07 DIAGNOSIS — Z5902 Unsheltered homelessness: Secondary | ICD-10-CM | POA: Insufficient documentation

## 2021-08-07 NOTE — Telephone Encounter (Signed)
Patient was sent here to obtain disability letter stating his diagnosis. Patient filled out ROI. Sent to medical records  ? ?

## 2021-08-07 NOTE — Discharge Instructions (Addendum)
Discharge recommendations:  ?Follow up at the [facility] outpatient for medication management.  ?  ?Safety:  ?The patient should abstain from use of illicit substances/drugs and abuse of any medications. ?If symptoms worsen or do not continue to improve or if the patient becomes actively suicidal or homicidal then it is recommended that the patient return to the closest hospital emergency department, the [facility], or call 911 for further evaluation and treatment. ?National Suicide Prevention Lifeline 1-800-SUICIDE or 267-431-1419. ? ?About 988 ?988 offers 24/7 access to trained crisis counselors who can help people experiencing mental health-related distress. People can call or text 988 or chat 988lifeline.org for themselves or if they are worried about a loved one who may need crisis support.  ? ? ?  ?

## 2021-08-07 NOTE — BH Assessment (Signed)
Elijah Castillo, Routine; 26 year old presents this date alone and unaccompanied. Pt denies SI, or HI.  Pt reports hearing voices, "the voices are trying to take everything from me".  Pt states "I am angry and aggressive".  Pt reports that he needs proof for his disability.  Pt admits to prior MH diagnosis; also, reports that he is not taken medication as prescribed for symptom management.  MSE signed by patient. ?

## 2021-08-07 NOTE — ED Notes (Signed)
Patient discharged with community resource. Patient to follow up with outpatient at [facility] behavioral health urgent care. ?

## 2021-08-07 NOTE — ED Provider Notes (Signed)
Behavioral Health Urgent Care Medical Screening Exam ? ?Patient Name: Elijah Castillo ?MRN: [id number] ?Date of Evaluation: 08/07/21 ?Diagnosis:  ?Final diagnoses:  ?Hallucinations  ? ? ?History of Present illness: Elijah Castillo is a 26 y.o. male patient who presents to the [facility] behavioral health urgent care voluntary as a walk-in unaccompanied with a chief complaint of requesting "proof of disability for bipolar paranoid schizophrenia."  ? ?Patient seen and evaluated face-to-face by this provider, chart reviewed. On evaluation, patient is alert and oriented x4. His thought process is logical with paranoia thought content. His speech is clear and coherent. His mood is euthymic and affect is congruent. He has good eye contact. He is casually dressed. ? ? ?Patient states that he needs to get proof of his "bipolar paranoid schizophrenia disorder" for disability. He reports hearing voices and states that why he is trying to get his "shit" straight. He describes the voices as "Elijah Castillo people telling me they are going to take everything." He reports hearing voices for the past 3 to 4 years. He denies visual hallucinations. He reports feeling paranoid like Elijah Castillo people are following him because he is black. There is no objective evidence that the patient is currently responding to internal or external stimuli. He is noted to be calm and cooperative during the assessment without any distractions.  He states that he has never taken medications and does not feel like he needs the medications. He states, "I prefer no meds and I am able to drown out the voices."    ? ?Patient denies suicidal or homicidal ideations. Patient denies self-injurious behaviors. Patient reports a fair appetite. Patient reports fair sleep, on average 7 to 8 hours per night. Patient states that he is homeless and lives on the streets right now. He states that he was last hospitalized last year for paranoid schizophrenia. He denies drinking  alcohol or using illicit drugs. ? ?I discussed with the patient medication options to help reduce the auditory hallucinations. Patient declines. Patient states that he only needs proof of "bipolar paranoid schizophrenia disorder." Patient has a charted past psychiatric history of schizoaffective disorder, unspecified type and psychosis. Patient was last evaluated for outpatient services at the [facility] in November 2022.  Patient was encouraged to follow up with outpatient services for proof of mental health diagnosis. Patient verbalizes understanding and agrees to the stated plan. ? ?Patient appears at baseline. Patient does not meet [facility] criteria for involuntary commitment.  Patient could benefit from outpatient services for medication management. Outpatient resources provided in the patient's AVS. ? ?Psychiatric Specialty Exam ? ?Presentation  ?General Appearance:Appropriate for Environment ? ?Eye Contact:Fair ? ?Speech:Clear and Coherent ? ?Speech Volume:Normal ? ?Handedness:Right ? ? ?Mood and Affect  ?Mood:Euthymic ? ?Affect:Congruent ? ? ?Thought Process  ?Thought Processes:Coherent ? ?Descriptions of Associations:Intact ? ?Orientation:Full (Time, Place and Person) ? ?Thought Content:Logical ? Diagnosis of Schizophrenia or Schizoaffective disorder in past: No ?  Hallucinations:Auditory ? ?Ideas of Reference:Paranoia ? ?Suicidal Thoughts:No ? ?Homicidal Thoughts:No ? ? ?Sensorium  ?Memory:Immediate Fair; Remote Fair; Recent Fair ? ?Judgment:Intact ? ?Insight:Present ? ? ?Executive Functions  ?Concentration:Fair ? ?Attention Span:Fair ? ?Recall:Fair ? ?Fund of Knowledge:Fair ? ?Language:Fair ? ? ?Psychomotor Activity  ?Psychomotor Activity:Normal ? ? ?Assets  ?Assets:Communication Skills; Desire for Improvement; Leisure Time; Physical Health ? ? ?Sleep  ?Sleep:Fair ? ?Number of hours: 8 ? ? ?No data recorded ? ?Physical Exam: ?Physical Exam ?Constitutional:   ?   Appearance: Normal appearance.  ?HENT:  ?  Head: Normocephalic.  ?   Nose: Nose normal.  ?Eyes:  ?   Conjunctiva/sclera: Conjunctivae normal.  ?Cardiovascular:  ?   Rate and Rhythm: Normal rate.  ?Pulmonary:  ?   Effort: Pulmonary effort is normal.  ?Musculoskeletal:     ?   General: Normal range of motion.  ?   Cervical back: Normal range of motion.  ?Neurological:  ?   Mental Status: He is alert and oriented to person, place, and time.  ? ?Review of Systems  ?Constitutional: Negative.   ?HENT: Negative.    ?Eyes: Negative.   ?Respiratory: Negative.    ?Cardiovascular: Negative.   ?Gastrointestinal: Negative.   ?Genitourinary: Negative.   ?Musculoskeletal: Negative.   ?Skin: Negative.   ?Neurological: Negative.   ?Endo/Heme/Allergies: Negative.   ?Blood pressure (!) 147/93, pulse 84, temperature 98.3 ?F (36.8 ?C), temperature source Oral, resp. rate 19, SpO2 100 %. There is no height or weight on file to calculate BMI. ? ?Musculoskeletal: ?Strength & Muscle Tone: within normal limits ?Gait & Station: normal ?Patient leans: N/A ? ? ?[facility] MSE Discharge Disposition for Follow up and Recommendations: ?Based on my evaluation the patient does not appear to have an emergency medical condition and can be discharged with resources and follow up care in outpatient services for Medication Management ? ? ?Layla Barter, NP ?08/07/2021, 1:04 PM ? ?

## 2021-08-08 NOTE — Telephone Encounter (Signed)
Message acknowledged and reviewed.

## 2021-08-11 ENCOUNTER — Encounter (HOSPITAL_COMMUNITY): Payer: Self-pay

## 2021-08-11 ENCOUNTER — Emergency Department (HOSPITAL_COMMUNITY)
Admission: EM | Admit: 2021-08-11 | Discharge: 2021-08-12 | Disposition: A | Discharge status: Home / Self Care | Payer: Self-pay | Attending: Emergency Medicine | Admitting: Emergency Medicine

## 2021-08-11 ENCOUNTER — Other Ambulatory Visit: Payer: Self-pay

## 2021-08-11 ENCOUNTER — Emergency Department (HOSPITAL_COMMUNITY): Payer: Self-pay

## 2021-08-11 DIAGNOSIS — F1510 Other stimulant abuse, uncomplicated: Secondary | ICD-10-CM | POA: Insufficient documentation

## 2021-08-11 DIAGNOSIS — R443 Hallucinations, unspecified: Secondary | ICD-10-CM

## 2021-08-11 DIAGNOSIS — F22 Delusional disorders: Secondary | ICD-10-CM

## 2021-08-11 DIAGNOSIS — I10 Essential (primary) hypertension: Secondary | ICD-10-CM | POA: Insufficient documentation

## 2021-08-11 DIAGNOSIS — R000 Tachycardia, unspecified: Secondary | ICD-10-CM | POA: Insufficient documentation

## 2021-08-11 LAB — CBC WITH DIFFERENTIAL/PLATELET
Abs Immature Granulocytes: 0.03 10*3/uL (ref 0.00–0.07)
Basophils Absolute: 0 10*3/uL (ref 0.0–0.1)
Basophils Relative: 0 %
Eosinophils Absolute: 0 10*3/uL (ref 0.0–0.5)
Eosinophils Relative: 0 %
HCT: 42.2 % (ref 39.0–52.0)
Hemoglobin: 14.9 g/dL (ref 13.0–17.0)
Immature Granulocytes: 0 %
Lymphocytes Relative: 9 %
Lymphs Abs: 0.7 10*3/uL (ref 0.7–4.0)
MCH: 32.5 pg (ref 26.0–34.0)
MCHC: 35.3 g/dL (ref 30.0–36.0)
MCV: 92.1 fL (ref 80.0–100.0)
Monocytes Absolute: 0.5 10*3/uL (ref 0.1–1.0)
Monocytes Relative: 6 %
Neutro Abs: 6.6 10*3/uL (ref 1.7–7.7)
Neutrophils Relative %: 85 %
Platelets: 207 10*3/uL (ref 150–400)
RBC: 4.58 MIL/uL (ref 4.22–5.81)
RDW: 12.9 % (ref 11.5–15.5)
WBC: 7.8 10*3/uL (ref 4.0–10.5)
nRBC: 0 % (ref 0.0–0.2)

## 2021-08-11 LAB — CK: Total CK: 239 U/L (ref 49–397)

## 2021-08-11 LAB — COMPREHENSIVE METABOLIC PANEL
ALT: 15 U/L (ref 0–44)
AST: 23 U/L (ref 15–41)
Albumin: 4.1 g/dL (ref 3.5–5.0)
Alkaline Phosphatase: 35 U/L — ABNORMAL LOW (ref 38–126)
Anion gap: 9 (ref 5–15)
BUN: 6 mg/dL (ref 6–20)
CO2: 22 mmol/L (ref 22–32)
Calcium: 9.3 mg/dL (ref 8.9–10.3)
Chloride: 106 mmol/L (ref 98–111)
Creatinine, Ser: 1.37 mg/dL — ABNORMAL HIGH (ref 0.61–1.24)
GFR, Estimated: >60 mL/min (ref >60)
Glucose, Bld: 104 mg/dL — ABNORMAL HIGH (ref 70–99)
Potassium: 3.5 mmol/L (ref 3.5–5.1)
Sodium: 137 mmol/L (ref 135–145)
Total Bilirubin: 1.6 mg/dL — ABNORMAL HIGH (ref 0.3–1.2)
Total Protein: 6.9 g/dL (ref 6.5–8.1)

## 2021-08-11 LAB — URINALYSIS, ROUTINE W REFLEX MICROSCOPIC
Bilirubin Urine: NEGATIVE
Glucose, UA: NEGATIVE mg/dL
Hgb urine dipstick: NEGATIVE
Ketones, ur: 5 mg/dL — AB
Leukocytes,Ua: NEGATIVE
Nitrite: NEGATIVE
Protein, ur: NEGATIVE mg/dL
Specific Gravity, Urine: 1.009 (ref 1.005–1.030)
pH: 7 (ref 5.0–8.0)

## 2021-08-11 LAB — RAPID URINE DRUG SCREEN, HOSP PERFORMED
Amphetamines: POSITIVE — AB
Barbiturates: NOT DETECTED
Benzodiazepines: NOT DETECTED
Cocaine: NOT DETECTED
Opiates: NOT DETECTED
Tetrahydrocannabinol: POSITIVE — AB

## 2021-08-11 LAB — ACETAMINOPHEN LEVEL: Acetaminophen (Tylenol), Serum: <10 ug/mL — ABNORMAL LOW (ref 10–30)

## 2021-08-11 LAB — TROPONIN I (HIGH SENSITIVITY)
Troponin I (High Sensitivity): 11 ng/L (ref <18)
Troponin I (High Sensitivity): 15 ng/L (ref <18)

## 2021-08-11 LAB — SALICYLATE LEVEL: Salicylate Lvl: <7 mg/dL — ABNORMAL LOW (ref 7.0–30.0)

## 2021-08-11 LAB — ETHANOL: Alcohol, Ethyl (B): <10 mg/dL (ref <10)

## 2021-08-11 MED ORDER — LORAZEPAM 2 MG/ML IJ SOLN
2.0000 mg | Freq: Once | INTRAMUSCULAR | Status: AC
  Administered 2021-08-11: 2 mg via INTRAMUSCULAR
  Filled 2021-08-11: qty 1

## 2021-08-11 MED ORDER — LORAZEPAM 2 MG/ML IJ SOLN
1.0000 mg | Freq: Once | INTRAMUSCULAR | Status: AC
  Administered 2021-08-11: 1 mg via INTRAVENOUS
  Filled 2021-08-11: qty 1

## 2021-08-11 MED ORDER — STERILE WATER FOR INJECTION IJ SOLN
INTRAMUSCULAR | Status: AC
  Administered 2021-08-11: 1 mL
  Filled 2021-08-11: qty 10

## 2021-08-11 MED ORDER — ZIPRASIDONE MESYLATE 20 MG IM SOLR
20.0000 mg | Freq: Once | INTRAMUSCULAR | Status: AC
Start: 2021-08-11 — End: 2021-08-11
  Administered 2021-08-11: 20 mg via INTRAMUSCULAR
  Filled 2021-08-11: qty 20

## 2021-08-11 MED ORDER — LACTATED RINGERS IV BOLUS
1000.0000 mL | Freq: Once | INTRAVENOUS | Status: AC
Start: 2021-08-11 — End: 2021-08-11
  Administered 2021-08-11: 1000 mL via INTRAVENOUS

## 2021-08-11 NOTE — ED Notes (Signed)
Pt found standing next to stretcher, pt had removed all cardiac monitors and IV stated he needs to go. Pt is aox4, is denying SI/HI. PT states he knows he took drugs and he stated "yall are real professional and have helped me but I gotta go". Rancour MD at bedside requesting pt stay, pt refusing stating he wants to leave. Dr Manus Gunning told pt we had more tests to run and we would give him some medication to calm him down. Pt agreeable and sat down on stretcher. ?

## 2021-08-11 NOTE — ED Triage Notes (Signed)
Pt is refusing vital signs and just keeps repeating that he is dying and needs emergency help right now to get those drugs out of his stomach.  ?

## 2021-08-11 NOTE — ED Notes (Signed)
Pt removed cardiac monitor, came out of bathroom asking how long he needed to be here. Pt asked by Charge RN to return to his room, pt stood there and did not move nor respond. RN escorted pt back to his room and reconnected him to the monitor. Rn educated pt on use of call bell. PT agreeable at this time. ?

## 2021-08-11 NOTE — ED Notes (Signed)
Pt is calm cooperative, and sleeping  ?

## 2021-08-11 NOTE — ED Notes (Signed)
Per CT pt was uncooperative during scan, would not sit still or follow directions. Pt refused CT scan and was brought back to trauma A ?

## 2021-08-11 NOTE — ED Provider Notes (Signed)
?[facility] EMERGENCY DEPARTMENT ?Provider Note ? ? ?CSN: [id number] ?Arrival date & time: 08/11/21  1729 ? ?  ? ?History ? ?Chief Complaint  ?Patient presents with  ? Drug Overdose  ? ? ?HAMPTON WIXOM is a 26 y.o. male. ? ?Level 5 caveat for psychiatric illness.  Patient very evasive and difficult time gathering history.  He apparently was brought in by EMS because he is "tweaking".  He states he "took an unknown narcotic" and does not know what it was and wants it to be pumped out of the stomach.  Patient states he bought some kind of pill off the street and does not know what it was or what it was supposed to be.  Since then he says he is "dying, feels like Satan, having an out of body experience".  He denies any suicidal thoughts or homicidal thoughts. ?He denies using any illicit drugs.  He is requesting to have his stomach pumped and feels like he is having out of body experience. ? ?The history is provided by the patient. The history is limited by the condition of the patient.  ?Drug Overdose ? ? ?  ? ?Home Medications ?Prior to Admission medications   ?Medication Sig Start Date End Date Taking? Authorizing Provider  ?paliperidone (INVEGA) 6 MG 24 hr tablet Take 1 tablet (6 mg total) by mouth at bedtime. 01/29/21   Meta Hatchet, PA  ?traZODone (DESYREL) 50 MG tablet Take 1 tablet (50 mg total) by mouth at bedtime. 01/29/21   Meta Hatchet, PA  ?   ? ?Allergies    ?Patient has no known allergies.   ? ?Review of Systems   ?Review of Systems  ?Unable to perform ROS: Psychiatric disorder  ? ?Physical Exam ?Updated Vital Signs ?BP (!) 169/90 (BP Location: Left Arm)   Pulse (!) 116   Temp 98.9 ?F (37.2 ?C) (Oral)   Resp (!) 22   Ht 6\' 5"  (1.956 m)   Wt 96 kg   SpO2 99%   BMI 25.10 kg/m?  ?Physical Exam ?Vitals and nursing note reviewed.  ?Constitutional:   ?   General: He is not in acute distress. ?   Appearance: He is well-developed.  ?   Comments: Dry mucous membranes, oriented to  person and place  ?HENT:  ?   Head: Normocephalic and atraumatic.  ?   Mouth/Throat:  ?   Mouth: Mucous membranes are dry.  ?   Pharynx: No oropharyngeal exudate.  ?Eyes:  ?   Conjunctiva/sclera: Conjunctivae normal.  ?   Pupils: Pupils are equal, round, and reactive to light.  ?Neck:  ?   Comments: No meningismus. ?Cardiovascular:  ?   Rate and Rhythm: Normal rate and regular rhythm.  ?   Heart sounds: Normal heart sounds. No murmur heard. ?Pulmonary:  ?   Effort: Pulmonary effort is normal. No respiratory distress.  ?   Breath sounds: Normal breath sounds.  ?Chest:  ?   Chest wall: No tenderness.  ?Abdominal:  ?   Palpations: Abdomen is soft.  ?   Tenderness: There is no abdominal tenderness. There is no guarding or rebound.  ?Musculoskeletal:     ?   General: No tenderness. Normal range of motion.  ?   Cervical back: Normal range of motion and neck supple.  ?   Comments: No clonus  ?Skin: ?   General: Skin is warm.  ?Neurological:  ?   Mental Status: He is alert.  ?  Cranial Nerves: No cranial nerve deficit.  ?   Motor: No abnormal muscle tone.  ?   Coordination: Coordination normal.  ?   Comments: Moves all extremities equally, 5/5 strength throughout  ?Psychiatric:     ?   Behavior: Behavior normal.  ? ? ?ED Results / Procedures / Treatments   ?Labs ?(all labs ordered are listed, but only abnormal results are displayed) ?Labs Reviewed  ?COMPREHENSIVE METABOLIC PANEL - Abnormal; Notable for the following components:  ?    Result Value  ? Glucose, Bld 104 (*)   ? Creatinine, Ser 1.37 (*)   ? Alkaline Phosphatase 35 (*)   ? Total Bilirubin 1.6 (*)   ? All other components within normal limits  ?ACETAMINOPHEN LEVEL - Abnormal; Notable for the following components:  ? Acetaminophen (Tylenol), Serum <10 (*)   ? All other components within normal limits  ?SALICYLATE LEVEL - Abnormal; Notable for the following components:  ? Salicylate Lvl <7.0 (*)   ? All other components within normal limits  ?URINALYSIS, ROUTINE W  REFLEX MICROSCOPIC - Abnormal; Notable for the following components:  ? Ketones, ur 5 (*)   ? All other components within normal limits  ?RAPID URINE DRUG SCREEN, HOSP PERFORMED - Abnormal; Notable for the following components:  ? Amphetamines POSITIVE (*)   ? Tetrahydrocannabinol POSITIVE (*)   ? All other components within normal limits  ?CBC WITH DIFFERENTIAL/PLATELET  ?ETHANOL  ?CK  ?TROPONIN I (HIGH SENSITIVITY)  ?TROPONIN I (HIGH SENSITIVITY)  ? ? ?EKG ?EKG Interpretation ? ?Date/Time:  Monday Aug 11 2021 19:04:22 EDT ?Ventricular Rate:  92 ?PR Interval:  153 ?QRS Duration: 99 ?QT Interval:  376 ?QTC Calculation: 466 ?R Axis:   76 ?Text Interpretation: Sinus rhythm Probable LVH with secondary repol abnrm Anterolateral Q wave, probably normal for age Abnormal T, probable ischemia, lateral leads progressed lateral T wave changes Confirmed by Glynn Octaveancour, Gricel Copen [phone]([id number]) on 08/11/2021 7:07:06 PM ? ?Radiology ?No results found. ? ?Procedures ?Procedures  ? ? ?Medications Ordered in ED ?Medications  ?lactated ringers bolus 1,000 mL (has no administration in time range)  ? ? ?ED Course/ Medical Decision Making/ A&P ?  ?                        ?Medical Decision Making ?Amount and/or Complexity of Data Reviewed ?Labs: ordered. ?Radiology: ordered. ?ECG/medicine tests: ordered. ? ?Risk ?Prescription drug management. ? ?Possible ingestion versus psychiatric episode.  Patient hypertensive, tachycardic on arrival, no fever.  He is oriented to person and place. ? ?Neurological exam is nonfocal.  He is given Ativan given his hypertension and tachycardia.  He is afebrile has no clonus. ? ?Patient given IV fluids and screening labs will be obtained. ?Drug screen is positive for amphetamines and THC. ? ?Screening labs reassuring. Normal electrolytes. Normal CK. No fever.  ?No clonus. Low suspicion for Serotonin syndrome or NMS.  ? ?Patient uncooperative.  Talking about being poisoned and talking out of his body and being a "disney  child".  He is oriented to person and place and time.  He denies feeling suicidal.  He states he is hearing voices.  ?He is agreeable to receive some medication for relaxation.  ?Refusing CT head.  ?Awaiting TTS consult. Holding orders placed.  ? ? ? ? ? ? ? ?Final Clinical Impression(s) / ED Diagnoses ?Final diagnoses:  ?None  ? ? ?Rx / DC Orders ?ED Discharge Orders   ? ? None  ? ?  ? ? ?  ?  Glynn Octave, MD ?08/11/21 2327 ? ?

## 2021-08-11 NOTE — ED Notes (Signed)
Pt not cooperative with rectal temp. RN attempted to explain procedure to pt, pt talking about covid and being a "disney child", pt is only alert to self. RN and NT attempting to turn pt on his side, pt telling staff to stop touching him and leave him alone. ?

## 2021-08-11 NOTE — ED Triage Notes (Signed)
Pt BIB [facility] for taking something and requesting his stomach pumped. Pt is tweaking.  ?

## 2021-08-11 NOTE — ED Notes (Signed)
Pt asked to use the bathroom, RN assisted pt with using urinal, pt stated " can you please send a sample down and test it to see what kind of drugs I am on".  ?

## 2021-08-11 NOTE — ED Notes (Signed)
Pt got up out of bed and was urinating on the floor. Pt brought back to his bed by RN and NT. Rancour MD aware ?

## 2021-08-12 ENCOUNTER — Emergency Department (HOSPITAL_COMMUNITY): Payer: Self-pay

## 2021-08-12 DIAGNOSIS — F1510 Other stimulant abuse, uncomplicated: Secondary | ICD-10-CM

## 2021-08-12 NOTE — Consult Note (Signed)
Telepsych Consultation  ? ?Reason for Consult:  paranoia and hearing voices.  ?Referring Physician:  Glynn Octave, MD ?Location of Patient: [facility] ?Location of Provider: [facility] ? ?Patient Identification: Elijah Castillo ?MRN:  [id number] ?Principal Diagnosis: Amphetamine abuse (HCC) ?Diagnosis:  Principal Problem: ?  Amphetamine abuse (HCC) ? ? ?Total Time spent with patient: 20 minutes ? ?Subjective:   ?Elijah Castillo is a 26 y.o. male patient admitted to the [facility] via EMS. Pt states, "I called (911) because I took 2-3 of the wrong narcotics and it felt like I was going to overdose. I called 911 and they sent the paramedics." ? ?HPI:  Patient seen via tele health by this provider; chart reviewed and consulted with Dr. Lucianne Muss on 08/12/21.  On evaluation Elijah Castillo is sitting upright in bed facing the camera with good eye contact. He is alert and oriented to person, place, time and situation. His thought process is logical and relevant. His speech is clear and coherent. His mood is euthymic and affect is congruent.  ? ?Patient states that he had an accidental ingestion of government pharmaceutical drugs. He states that he thought he had an accident or seizure because he possibly took Adderall or an unknown medication. He states that he was taken the medication every 2 hours throughout the day and think that he took too many throughout the day and called for help because he thought he would have a heart attack. When asked where does he get the drugs from, he states, that is not important and that the person gets them legally. He states that when he was a young age he was put on Adderall, Vyvanse, Strattera, and Ritalin. He states that it helps to speed up the brain, suppresses your appetite and he needed it to help him to focus on a task. He states that comes from him starting the medications at an early age. I discussed with the patient the importance of not taking medications that are not prescribed to  him and the lethality and cardiac effects associated with taking stimulants. Patient verbalizes understanding.  ? ?He denies suicidal ideations. He states that the incident yesterday was not an attempt to hurt himself and that he was trying to focus on a tasks and ended up taking too many pills so he called EMS. He denies homicidal ideations. He denies auditory and visual hallucinations. He denies paranoia thought content. There is no objective evidence during the assessment that the patient is currently responding to internal or external stimuli or exhibiting delusional or paranoid thought content. ? ?Patient's UDS positive for amphetamines and THC. Patient denies using other illicit drugs but states that he does smoke marijuana when he feels like he needs to cough to expel phlegm. He denies drinking alcohol. He states that he lives with a friend of the family. 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  ? ?Patient  gives verbal consent for this provider to speak to this mother Octave Montrose [phone]. Mrs. Mascio states that that the patient never took his medication when he was discharged from the hospital. She states that the patient was not receptive to taking the medications. She states that she will bring the patient to the [facility] behavioral health outpatient to schedule an appointment for follow-up. She denies any safety concerns with the patient discharging home. She states that she will pick the patient up from the emergency department. ? ? ?Past Psychiatric History: per chart reviews, patient has a  hx of schizoaffective disorder, ADHD and psychosis.  ? ?Risk to Self:  denies  ?Risk to Others:  denies  ?Prior Inpatient Therapy:  yes  ?Prior Outpatient Therapy:  yes  ? ?Past Medical History:  ?Past Medical History:  ?Diagnosis Date  ? ADD (attention deficit disorder)   ? Antisocial personality disorder in adolescent [facility](HCC)   ? Psychosis (HCC)   ?  ?Past Surgical History:  ?Procedure  Laterality Date  ? NERVE, TENDON AND ARTERY REPAIR Left 12/15/2018  ? Procedure: EXPLORATION REPAIR TENDON LEFT INDEX AND LACERATION LEFT MIDDLE FINGER;  Surgeon: Betha LoaKuzma, Kevin, MD;  Location: [facility];  Service: Orthopedics;  Laterality: Left;  ? TONSILLECTOMY  2000  ? ?Family History:  ?Family History  ?Problem Relation Age of Onset  ? Arthritis Mother   ? Hyperlipidemia Mother   ? Hypertension Mother   ? Alcohol abuse Maternal Grandmother   ? Diabetes Maternal Grandmother   ? Heart disease Maternal Grandmother   ? Diabetes Maternal Grandfather   ? Heart disease Maternal Grandfather   ? Alcohol abuse Paternal Grandfather   ? ?Family Psychiatric  History: No known history.  ?Social History:  ?Social History  ? ?Substance and Sexual Activity  ?Alcohol Use Yes  ? Alcohol/week: 0.0 standard drinks  ? Comment: rare--beer  ?   ?Social History  ? ?Substance and Sexual Activity  ?Drug Use Yes  ? Types: Marijuana  ?  ?Social History  ? ?Socioeconomic History  ? Marital status: Single  ?  Spouse name: Not on file  ? Number of children: Not on file  ? Years of education: Not on file  ? Highest education level: Not on file  ?Occupational History  ? Not on file  ?Tobacco Use  ? Smoking status: Every Day  ?  Packs/day: 0.33  ?  Years: 8.00  ?  Pack years: 2.64  ?  Types: Cigarettes  ? Smokeless tobacco: Never  ?Vaping Use  ? Vaping Use: Never used  ?Substance and Sexual Activity  ? Alcohol use: Yes  ?  Alcohol/week: 0.0 standard drinks  ?  Comment: rare--beer  ? Drug use: Yes  ?  Types: Marijuana  ? Sexual activity: Yes  ?  Birth control/protection: Condom  ?Other Topics Concern  ? Not on file  ?Social History Narrative  ? Not on file  ? ?Social Determinants of Health  ? ?Financial Resource Strain: Not on file  ?Food Insecurity: Not on file  ?Transportation Needs: Not on file  ?Physical Activity: Not on file  ?Stress: Not on file  ?Social Connections: Not on file  ? ?Additional Social History: ?  ? ?Allergies:   No Known Allergies ? ?Labs:  ?Results for orders placed or performed during the hospital encounter of 08/11/21 (from the past 48 hour(s))  ?CBC with Differential     Status: None  ? Collection Time: 08/11/21  7:24 PM  ?Result Value Ref Range  ? WBC 7.8 4.0 - 10.5 K/uL  ? RBC 4.58 4.22 - 5.81 MIL/uL  ? Hemoglobin 14.9 13.0 - 17.0 g/dL  ? HCT 42.2 39.0 - 52.0 %  ? MCV 92.1 80.0 - 100.0 fL  ? MCH 32.5 26.0 - 34.0 pg  ? MCHC 35.3 30.0 - 36.0 g/dL  ? RDW 12.9 11.5 - 15.5 %  ? Platelets 207 150 - 400 K/uL  ? nRBC 0.0 0.0 - 0.2 %  ? Neutrophils Relative % 85 %  ? Neutro Abs 6.6 1.7 - 7.7 K/uL  ?  Lymphocytes Relative 9 %  ? Lymphs Abs 0.7 0.7 - 4.0 K/uL  ? Monocytes Relative 6 %  ? Monocytes Absolute 0.5 0.1 - 1.0 K/uL  ? Eosinophils Relative 0 %  ? Eosinophils Absolute 0.0 0.0 - 0.5 K/uL  ? Basophils Relative 0 %  ? Basophils Absolute 0.0 0.0 - 0.1 K/uL  ? Immature Granulocytes 0 %  ? Abs Immature Granulocytes 0.03 0.00 - 0.07 K/uL  ?  Comment: Performed at [facility], [address], [place], [place] [address]  ?Comprehensive metabolic panel     Status: Abnormal  ? Collection Time: 08/11/21  7:24 PM  ?Result Value Ref Range  ? Sodium 137 135 - 145 mmol/L  ? Potassium 3.5 3.5 - 5.1 mmol/L  ? Chloride 106 98 - 111 mmol/L  ? CO2 22 22 - 32 mmol/L  ? Glucose, Bld 104 (H) 70 - 99 mg/dL  ?  Comment: Glucose reference range applies only to samples taken after fasting for at least 8 hours.  ? BUN 6 6 - 20 mg/dL  ? Creatinine, Ser 1.37 (H) 0.61 - 1.24 mg/dL  ? Calcium 9.3 8.9 - 10.3 mg/dL  ? Total Protein 6.9 6.5 - 8.1 g/dL  ? Albumin 4.1 3.5 - 5.0 g/dL  ? AST 23 15 - 41 U/L  ? ALT 15 0 - 44 U/L  ? Alkaline Phosphatase 35 (L) 38 - 126 U/L  ? Total Bilirubin 1.6 (H) 0.3 - 1.2 mg/dL  ? GFR, Estimated >60 >60 mL/min  ?  Comment: (NOTE) ?Calculated using the CKD-EPI Creatinine Equation (2021) ?  ? Anion gap 9 5 - 15  ?  Comment: Performed at [facility], [address] [place], [place] [address]  ?Ethanol     Status:  None  ? Collection Time: 08/11/21  7:24 PM  ?Result Value Ref Range  ? Alcohol, Ethyl (B) <10 <10 mg/dL  ?  Comment: (NOTE) ?Lowest detectable limit for serum alcohol is 10 mg/dL. ? ?For medical purposes on

## 2021-08-12 NOTE — Discharge Instructions (Addendum)
It was our pleasure to provide your ER care today - we hope that you feel better. ? ?Avoid methamphetamine or other drug use as it is harmful to your physical health, and mental well-being.  ? ?Follow up with primary care doctor and behavioral health specialist in the next 1-2 weeks. ? ?For mental health issues and/or crisis, you may also go directly to the [facility] [place].  ? ?Return to ER if worse, new symptoms, fevers, chest pain, trouble breathing, or other concern.  ? ?Discharge recommendations:  ?Please call or walk-in to follow up with your outpatient provider for medication management and therapy.  ? ?[facility] ?[address]  ?[place], [place]  [address] ?Phone: [phone] ? ?Walk-in open access hours are on Mondays, Wednesdays and Fridays from 8 am to 11 am. Please arrive at 7:30 am for intake.  ? ?See resource guide for list of counseling/substance abuse facilities for treatment options.  ? ?The patient should abstain from use of illicit substances/drugs and abuse of any medications. ?If symptoms worsen or do not continue to improve or if the patient becomes actively suicidal or homicidal then it is recommended that the patient return to the closest hospital emergency department, the [facility], or call 911 for further evaluation and treatment. ?National Suicide Prevention Lifeline 1-800-SUICIDE or 906 596 9075. ? ?About 988 ?988 offers 24/7 access to trained crisis counselors who can help people experiencing mental health-related distress. People can call or text 988 or chat 988lifeline.org for themselves or if they are worried about a loved one who may need crisis support.  ? ? ? ?  ?

## 2021-08-12 NOTE — BH Assessment (Signed)
Clinician made contact with pt's team at 0128 in an effort to complete pt's MH Assessment. At 0139 pt's nurse, Joaquin Courts, responded that pt was medicated and is currently unable to participate in an assessment; she stated she would advise TTS of any changes. TTS to attempt assessment at a later time. ?
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
## 2021-08-12 NOTE — ED Provider Notes (Signed)
Emergency Medicine Observation Re-evaluation Note ? ?Elijah Castillo is a 26 y.o. male, seen on rounds today.  Pt initially presented to the ED for complaints of behavioral health evaluation.  UDS +meth. Pt currently alert, content, no c/o.  ? ?Physical Exam  ?BP (!) 134/96 (BP Location: Left Arm)   Pulse 96   Temp 98.6 ?F (37 ?C) (Oral)   Resp 17   Ht 1.956 m (6\' 5" )   Wt 96 kg   SpO2 95%   BMI 25.10 kg/m?  ?Physical Exam ?General: alert, content.  ?Cardiac: regular rate ?Lungs: breathing comfortably ?Psych: alert, content. Pt w normal mood and affect. Pt does not appear acutely depressed or despondent. No SI/HI.  Pt is not responding to internal stimuli - no delusions or hallucinations noted.  ? ?ED Course / MDM  ? ?I have reviewed the labs performed to date as well as medications administered while in observation.  Recent changes in the last 24 hours include ED obs, BH reassessment.  ? ?Plan  ? ? Elijah Castillo is not under involuntary commitment. ? ?Patient has been reassessed by psychiatry, who recommends d/c. Outpatient resources provided. ? ? ?  ?Elijah Poli, MD ?08/12/21 1022 ? ?

## 2021-08-31 NOTE — BH Assessment (Signed)
Care Management - BHUC Follow Up Discharges   Writer attempted to make contact with patient today and was unsuccessful.  Phone just rang, no voicemail.   Per chart review, patient was provided with outpatient resources.  

## 2023-08-12 IMAGING — CT CT HEAD W/O CM
4 series · 17 of 47 positions shown, 19 images · non-contrast
Comparison: No priors.

CLINICAL DATA: 26-year-old male with history of delirium from a
drug overdose.



[Series 3: head wo · axial · 0.43mm/px · z∈[-157,-37]mm · 7 of 33 slices shown, 9 images]
[im 5/33  brain]
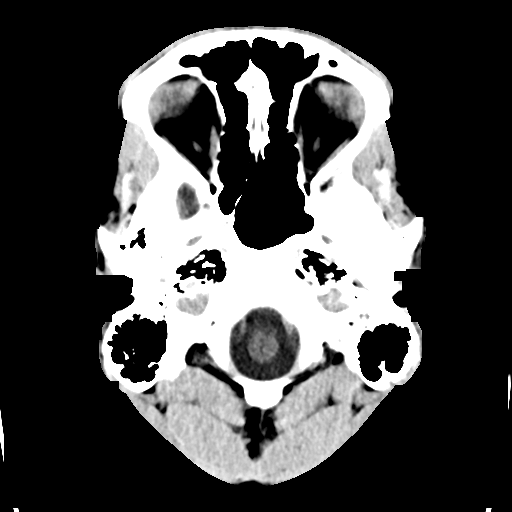
[im 5/33  bone]
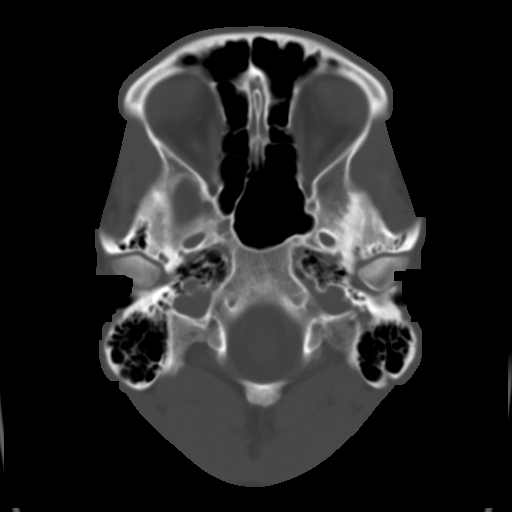
[im 9/33  brain]
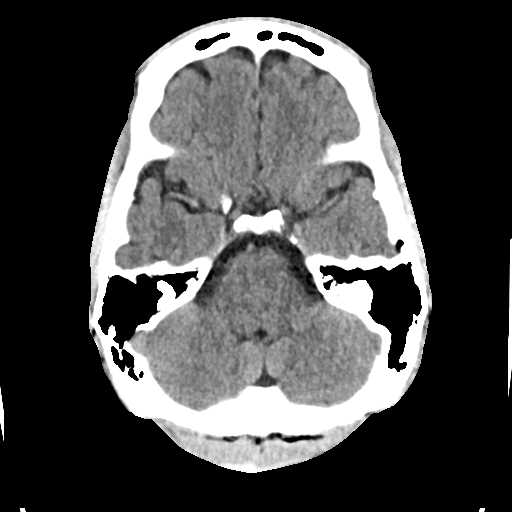
[im 13/33  brain]
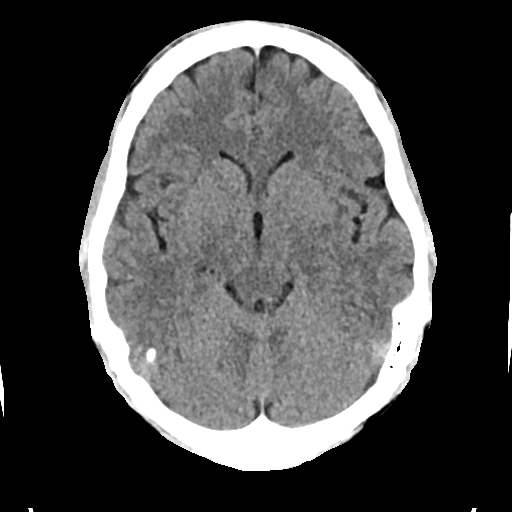
[im 17/33  brain]
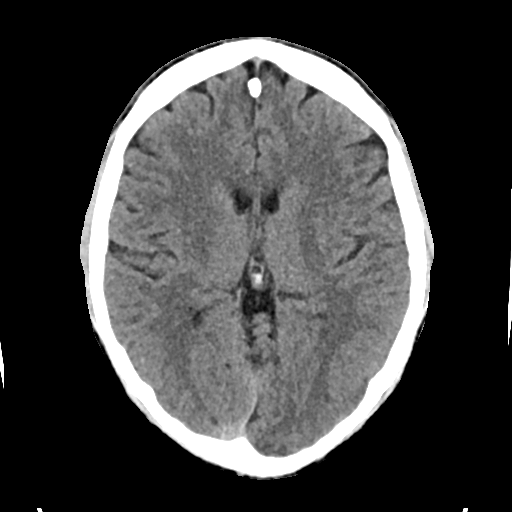
[im 21/33  brain]
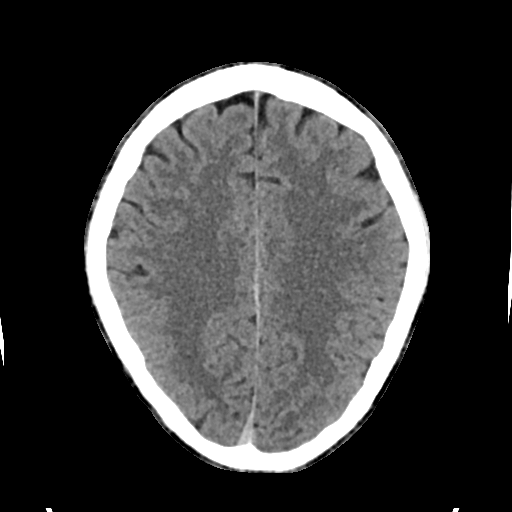
[im 21/33  bone]
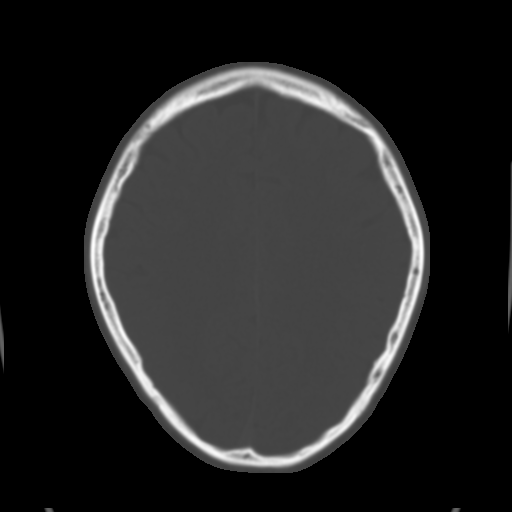
[im 25/33  brain]
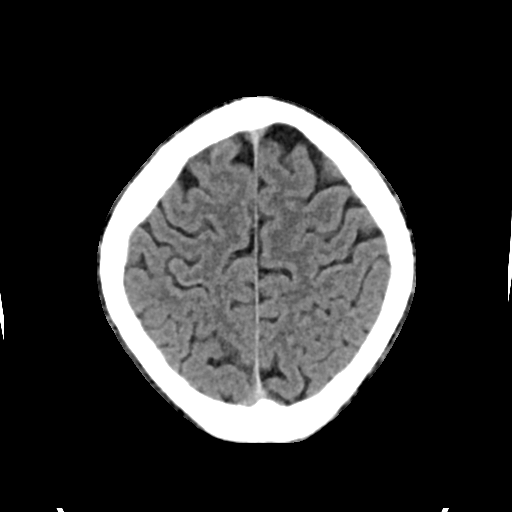
[im 29/33  brain]
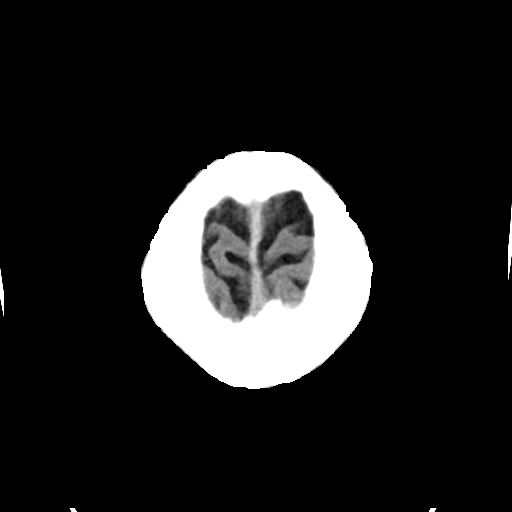

[Series 4: head bone · axial · 0.43mm/px · z∈[-161,-105]mm · 4 of 83 slices shown]
[im 9/83  bone]
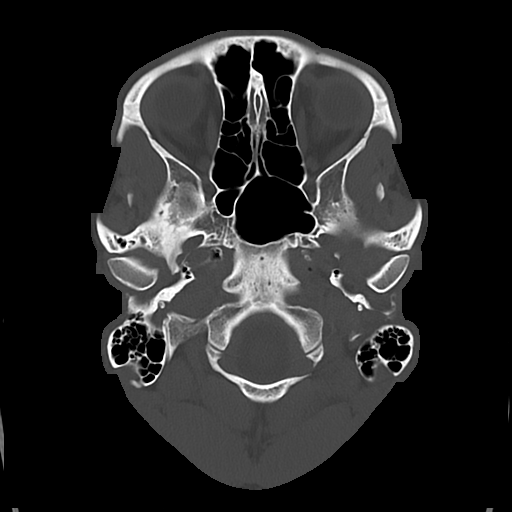
[im 17/83  bone]
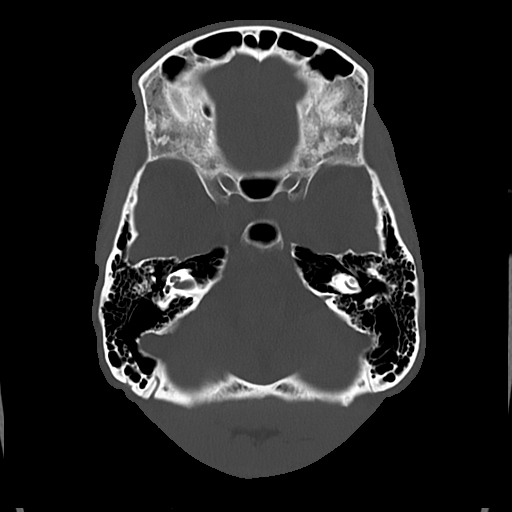
[im 25/83  bone]
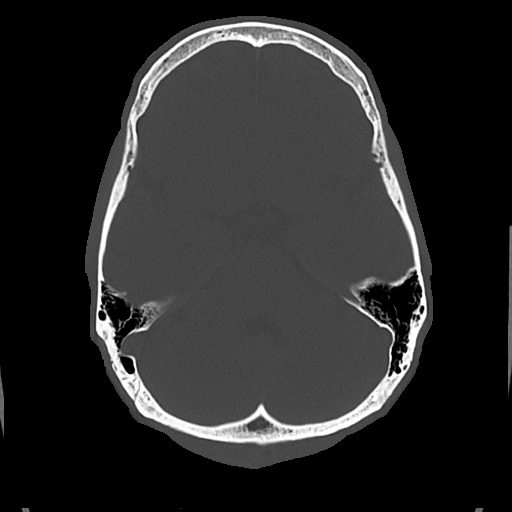
[im 37/83  bone]
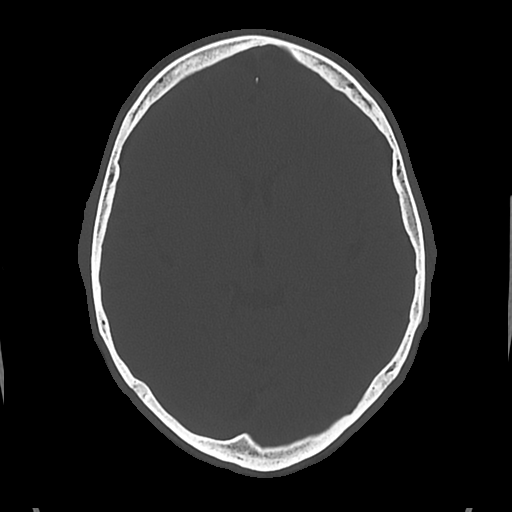

[Series 5: cor soft · coronal · 0.35mm/px · 3 of 71 slices shown]
[im 24/71  brain]
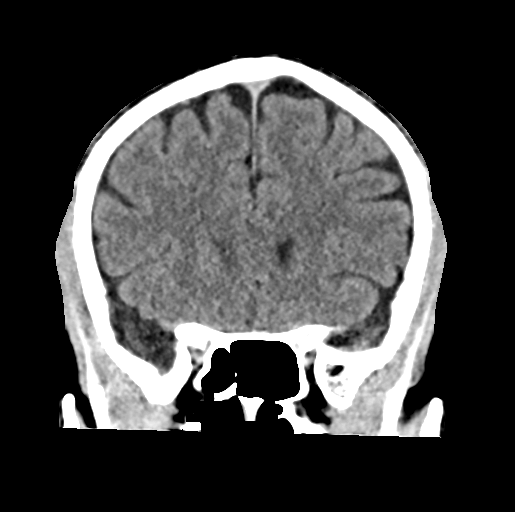
[im 32/71  brain]
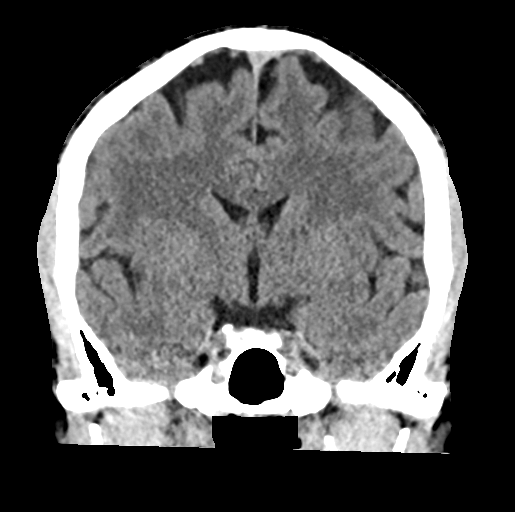
[im 39/71  brain]
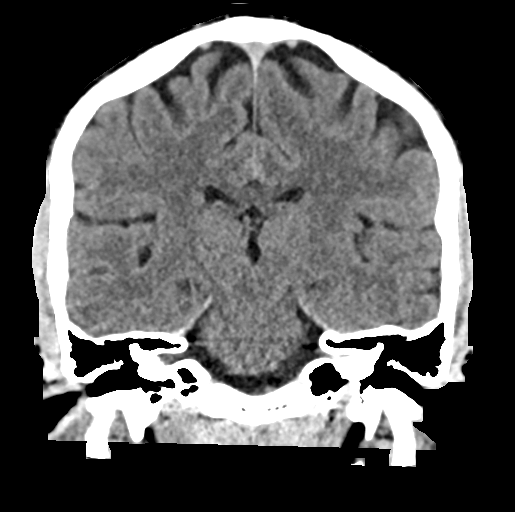

[Series 6: sag soft · sagittal · 0.35mm/px · 3 of 59 slices shown]
[im 20/59  brain]
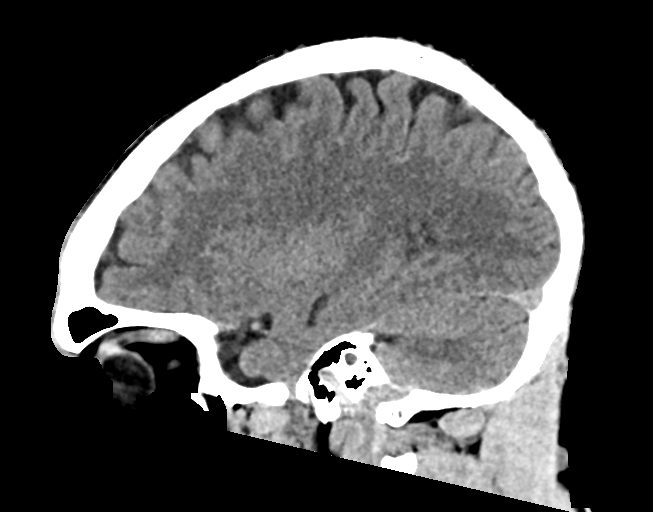
[im 30/59  brain]
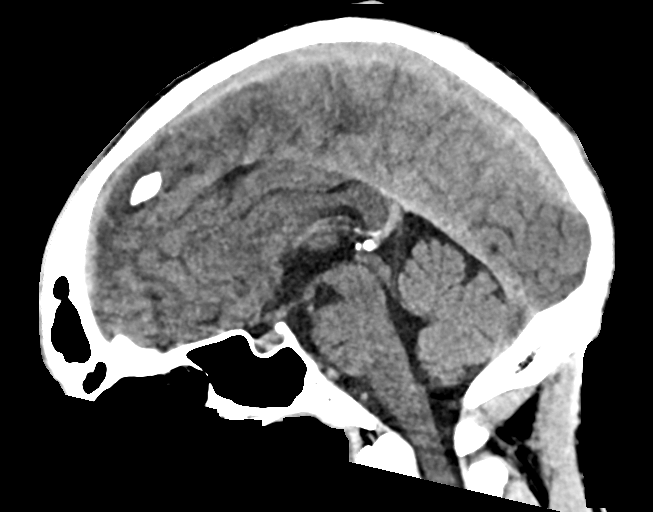
[im 39/59  brain]
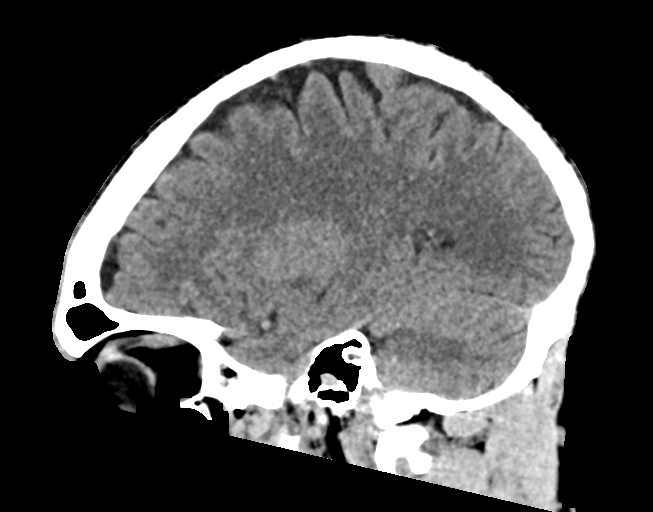

[17 of 47 positions shown; findings below may reference images not displayed]

FINDINGS: Brain: No evidence of acute infarction, hemorrhage, hydrocephalus,
extra-axial collection or mass lesion/mass effect.

Vascular: No hyperdense vessel or unexpected calcification.

Skull: Normal. Negative for fracture or focal lesion.

Sinuses/Orbits: No acute finding.

Other: None.
IMPRESSION: 1. No acute intracranial abnormalities. The appearance of the brain
is normal.

## 2023-12-03 ENCOUNTER — Other Ambulatory Visit: Payer: Self-pay

## 2023-12-03 ENCOUNTER — Emergency Department (HOSPITAL_COMMUNITY)
Admission: EM | Admit: 2023-12-03 | Discharge: 2023-12-03 | Discharge status: Against Medical Advice | Payer: MEDICAID | Attending: Emergency Medicine | Admitting: Emergency Medicine

## 2023-12-03 ENCOUNTER — Encounter (HOSPITAL_COMMUNITY): Payer: Self-pay | Admitting: Emergency Medicine

## 2023-12-03 DIAGNOSIS — F419 Anxiety disorder, unspecified: Secondary | ICD-10-CM | POA: Diagnosis present

## 2023-12-03 DIAGNOSIS — Z5321 Procedure and treatment not carried out due to patient leaving prior to being seen by health care provider: Secondary | ICD-10-CM | POA: Diagnosis not present

## 2023-12-03 NOTE — ED Triage Notes (Signed)
 Patient report anxiety x 2 days. Patient report he got a IM Injection from Dr. Lawerance last Wednesday that suppose to help his anxiety.  Patient report it make him more anxious and jittery.

## 2023-12-03 NOTE — ED Provider Notes (Signed)
 Patient eloped prior to being evaluated by me.   Hildegard Loge, PA-C 12/03/23 2031    Dasie Faden, MD 12/03/23 [phone]

## 2023-12-03 NOTE — ED Notes (Signed)
 Patient states his mom is in the car and she doesn't want to be in anyone's way. Patient left.
